# Patient Record
Sex: Female | Born: 1970 | Race: White | Hispanic: No | Marital: Single | Smoking: Current every day smoker
Health system: Southern US, Community
[De-identification: ages and names within clinical notes are randomized; demographics above are authoritative.]

## PROBLEM LIST (undated history)

## (undated) ENCOUNTER — Emergency Department (HOSPITAL_COMMUNITY): Admission: EM | Payer: Medicaid Other | Source: Home / Self Care

## (undated) ENCOUNTER — Emergency Department (HOSPITAL_COMMUNITY): Payer: Medicaid Other

## (undated) DIAGNOSIS — G629 Polyneuropathy, unspecified: Secondary | ICD-10-CM

## (undated) DIAGNOSIS — R519 Headache, unspecified: Secondary | ICD-10-CM

## (undated) DIAGNOSIS — M5430 Sciatica, unspecified side: Secondary | ICD-10-CM

## (undated) DIAGNOSIS — F32A Depression, unspecified: Secondary | ICD-10-CM

## (undated) DIAGNOSIS — M1990 Unspecified osteoarthritis, unspecified site: Secondary | ICD-10-CM

## (undated) DIAGNOSIS — J189 Pneumonia, unspecified organism: Secondary | ICD-10-CM

## (undated) DIAGNOSIS — M961 Postlaminectomy syndrome, not elsewhere classified: Secondary | ICD-10-CM

## (undated) DIAGNOSIS — G5600 Carpal tunnel syndrome, unspecified upper limb: Secondary | ICD-10-CM

## (undated) DIAGNOSIS — Z973 Presence of spectacles and contact lenses: Secondary | ICD-10-CM

## (undated) DIAGNOSIS — K439 Ventral hernia without obstruction or gangrene: Secondary | ICD-10-CM

## (undated) DIAGNOSIS — D649 Anemia, unspecified: Secondary | ICD-10-CM

## (undated) HISTORY — PX: KNEE SURGERY: SHX244

## (undated) HISTORY — PX: TONSILLECTOMY: SUR1361

---

## 2018-11-04 HISTORY — PX: LAMINECTOMY: SHX219

## 2020-01-03 HISTORY — PX: LUMBAR FUSION: SHX111

## 2020-03-01 HISTORY — PX: LUMBAR FUSION: SHX111

## 2020-11-04 HISTORY — PX: BILATERAL CARPAL TUNNEL RELEASE: SHX6508

## 2020-12-26 ENCOUNTER — Ambulatory Visit (HOSPITAL_COMMUNITY): Payer: Medicaid Other | Attending: Pain Medicine | Admitting: Physical Therapy

## 2020-12-26 ENCOUNTER — Other Ambulatory Visit: Payer: Self-pay

## 2020-12-26 DIAGNOSIS — M5416 Radiculopathy, lumbar region: Secondary | ICD-10-CM | POA: Diagnosis present

## 2020-12-26 DIAGNOSIS — M545 Low back pain, unspecified: Secondary | ICD-10-CM | POA: Insufficient documentation

## 2020-12-26 NOTE — Addendum Note (Signed)
Addended by: Josue Hector A on: 12/26/2020 04:55 PM   Modules accepted: Orders

## 2020-12-26 NOTE — Patient Instructions (Signed)
Access Code: [email or web address] URL: [email or web address] Date: 12/26/2020 Prepared by: Josue Hector  Exercises Lying Prone with 1 Pillow - 2-3 x daily - 7 x weekly - 1 sets - 1 reps - 3-5 minutes hold Lying Prone - 2-3 x daily - 7 x weekly - 1 sets - 1 reps - 3-5 minutes hold Supine Transversus Abdominis Bracing - Hands on Stomach - 2-3 x daily - 7 x weekly - 2 sets - 10 reps - 5 seconds hold

## 2020-12-26 NOTE — Therapy (Signed)
[place], [place], [address] Phone: [phone]   Fax:  [phone]  Physical Therapy Evaluation  Patient Details  Name: Wendy Krause MRN: [id number] Date of Birth: 06-19-1971 No data recorded  Encounter Date: 12/26/2020   PT End of Session - 12/26/20 1613    Visit Number 1    Number of Visits 8    Date for PT Re-Evaluation 01/23/21    Authorization Type Medicaid healthy blue (check auth)    PT Start Time 1520    PT Stop Time 1615    PT Time Calculation (min) 55 min    Activity Tolerance Patient tolerated treatment well    Behavior During Therapy [facility] for tasks assessed/performed           No past medical history on file.  The histories are not reviewed yet. Please review them in the "History" navigator section and refresh this [place].  There were no vitals filed for this visit.    Subjective Assessment - 12/26/20 1536    Subjective Patient presents to physical therapy with complaint of chronic low back pain. She had injury to L5-S1 2019. She had lumbar fusion in 2021, but it failed. She had a second surgery later that year. 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 She wears slippers because she can't wear shoes. She also reports bilateral radiculopathies in UEs. She has had testing for this and was diagnosed with carpal tunnel. She recently had lumbar injections which she says has helped some. Patient currently managing pain with prescribed medication.    Pertinent History Lumbar fusion x 2    Limitations Lifting;Standing;Walking;House hold activities    How long can you stand comfortably? 5 minutes    How long can you walk comfortably? pain with walking    Patient Stated Goals Pain relief, get back to function    Currently in Pain? Yes    Pain Score 7     Pain Location Back    Pain Orientation Posterior;Lower;Right;Left    Pain Descriptors / Indicators Sharp;Nagging;Pressure;Numbness;Tingling     Pain Type Chronic pain    Pain Radiating Towards Bilateral feet    Pain Onset More than a month ago    Pain Frequency Constant    Aggravating Factors  standing, walking, bending    Pain Relieving Factors Meds, heat    Effect of Pain on Daily Activities Limits              [facility] PT Assessment - 12/26/20 0001      Assessment   Medical Diagnosis LBP (P)     Referring Provider (PT) Sherlyn Lees MD (P)     Onset Date/Surgical Date -- (P)    chronic >several years   Prior Therapy No (P)       Precautions   Precautions None (P)       Restrictions   Weight Bearing Restrictions No (P)       Balance Screen   Has the patient fallen in the past 6 months Yes (P)     How many times? 3 (P)     Has the patient had a decrease in activity level because of a fear of falling?  Yes (P)     Is the patient reluctant to leave their home because of a fear of falling?  Yes (P)       Home Environment   Living Environment Private residence (P)     Living Arrangements Spouse/significant  other;Children (P)       Prior Function   Level of Independence Independent with basic ADLs (P)       Cognition   Overall Cognitive Status Within Functional Limits for tasks assessed (P)       ROM / Strength   AROM / PROM / Strength AROM;Strength (P)       AROM   AROM Assessment Site Lumbar (P)     Lumbar Flexion 50% limited (P)     Lumbar Extension 100% limited (P)     Lumbar - Right Side Bend 75% limited (P)     Lumbar - Left Side Bend 75% limited (P)       Transfers   Transfers Sit to Stand    Sit to Stand 5: Supervision    Comments slow labored movement wiht pain, decreased weight shift to LT      Ambulation/Gait   Ambulation/Gait Yes    Ambulation/Gait Assistance 5: Supervision    Ambulation Distance (Feet) 250 Feet    Assistive device None    Gait Pattern Decreased arm swing - left;Decreased stride length;Decreased stance time - left;Decreased step length - right;Antalgic;Trunk flexed     Ambulation Surface Level;Indoor    Gait Comments 2MWT                      Objective measurements completed on examination: See above findings.       [place] Adult PT Treatment/Exercise - 12/26/20 0001      Exercises   Exercises Lumbar      Lumbar Exercises: Supine   Ab Set 10 reps;5 seconds      Lumbar Exercises: Prone   Other Prone Lumbar Exercises prone lying with pillow under hips 3 min                  PT Education - 12/26/20 1539    Education Details on evaluation findings, POC and HEP    Person(s) Educated Patient    Methods Explanation;Handout    Comprehension Verbalized understanding            PT Short Term Goals - 12/26/20 1646      PT SHORT TERM GOAL #1   Title Patient will be independent with initial HEP and self-management strategies to improve functional outcomes    Time 2    Period Weeks    Status New    Target Date 01/09/21             PT Long Term Goals - 12/26/20 1646      PT LONG TERM GOAL #1   Title Patient will report at least 60% overall improvement in subjective complaint to indicate improvement in ability to perform ADLs.    Time 4    Period Weeks    Status New    Target Date 01/23/21      PT LONG TERM GOAL #2   Title Patient will be able to ambulate at least 325 feet during 2MWT with LRAD to demonstrate improved ability to perform functional mobility and associated tasks.    Time 4    Period Weeks    Status New    Target Date 01/23/21      PT LONG TERM GOAL #3   Title Patient will be able to stand > 15 minutes with pain not to exceed 3/10 in lumbar to improve ability to perform cooking and grooming ADLs.    Time 4    Period Weeks  Status New    Target Date 01/23/21      PT LONG TERM GOAL #4   Title Patient will improve lumbar AROM by 25% in all restricted planes for improved ability to perform functional mobility tasks and ADLs.    Time 4    Period Weeks    Status New    Target Date 01/23/21                   Plan - 12/26/20 1642    Clinical Impression Statement Patient is a 50 y.o. female who presents to physical therapy with complaint of low back pain with bilateral radiculopathy. Patient demonstrates decreased strength, ROM restriction, balance deficits and gait abnormalities which are likely contributing to symptoms of pain and are negatively impacting patient ability to perform ADLs and functional mobility tasks. Patient will benefit from skilled physical therapy services to address these deficits to reduce pain, improve level of function with ADLs, functional mobility tasks, and reduce risk for falls.    Examination-Activity Limitations Locomotion Level;Bed Mobility;Lift;Stand;Sleep;Stairs;Transfers;Bend    Examination-Participation Restrictions Laundry;Community Activity;Cleaning;Yard Work    Merchant [employer] officer Stable/Uncomplicated    Designer, jewellery Low    Rehab Potential Fair    PT Frequency 2x / week    PT Duration 4 weeks    PT Treatment/Interventions ADLs/Self Care Home Management;Aquatic Therapy;Biofeedback;Canalith Repostioning;Ultrasound;Traction;Functional mobility training;Stair training;DME Instruction;Gait training;Iontophoresis 4mg /ml Dexamethasone;Moist Heat;Electrical Stimulation;Contrast Bath;Therapeutic exercise;Therapeutic activities;Parrafin;Fluidtherapy;Cryotherapy;Scar mobilization;Balance training;Vestibular;Visual/perceptual remediation/compensation;Neuromuscular re-education;Passive range of motion;Dry needling;Manual techniques;Patient/family education;Manual lymph drainage;Energy conservation;Spinal Manipulations;Splinting;Joint Manipulations;Compression bandaging;Orthotic Fit/Training;Taping;Vasopneumatic Device    PT Next Visit Plan Review goals and HEP. Assess response to extension based HEP and progress to prone on elbows if tolerated well. Add glute set/ bridge, bent knee march. Manual for pain and restriciton as needed.     PT Home Exercise Plan Eval: ab set, prone lying with pillow> prone lying flat    Consulted and Agree with Plan of Care Patient           Patient will benefit from skilled therapeutic intervention in order to improve the following deficits and impairments:  Pain,Improper body mechanics,Impaired sensation,Increased fascial restricitons,Abnormal gait,Decreased balance,Difficulty walking,Decreased activity tolerance,Decreased range of motion,Decreased strength,Impaired perceived functional ability  Visit Diagnosis: Low back pain, unspecified back pain laterality, unspecified chronicity, unspecified whether sciatica present  Radiculopathy, lumbar region     Problem List There are no problems to display for this patient.  4:51 PM, 12/26/20 Josue Hector PT DPT  Physical Therapist with [facility]  [phone]   [facility] [address] [place], [place], [address] Phone: [phone]   Fax:  [phone]  Name: Destin Kittler MRN: [id number] Date of Birth: 1971/09/09

## 2021-01-02 ENCOUNTER — Encounter (HOSPITAL_COMMUNITY): Payer: Self-pay | Admitting: Physical Therapy

## 2021-01-02 ENCOUNTER — Ambulatory Visit (HOSPITAL_COMMUNITY): Payer: Medicaid Other | Attending: Pain Medicine | Admitting: Physical Therapy

## 2021-01-02 ENCOUNTER — Other Ambulatory Visit: Payer: Self-pay

## 2021-01-02 DIAGNOSIS — M545 Low back pain, unspecified: Secondary | ICD-10-CM | POA: Insufficient documentation

## 2021-01-02 DIAGNOSIS — M5416 Radiculopathy, lumbar region: Secondary | ICD-10-CM | POA: Diagnosis present

## 2021-01-02 NOTE — Therapy (Signed)
[place], [place], [address] Phone: [phone]   Fax:  [phone]  Physical Therapy Treatment  Patient Details  Name: Wendy Krause MRN: [id number] Date of Birth: Sep 14, 1971 No data recorded  Encounter Date: 01/02/2021   PT End of Session - 01/02/21 1735    Visit Number 2    Number of Visits 8    Date for PT Re-Evaluation 01/23/21    Authorization Type Medicaid healthy blue (check auth/ still pending 3/1)    PT Start Time 1730    PT Stop Time 1808    PT Time Calculation (min) 38 min    Activity Tolerance Patient limited by pain    Behavior During Therapy [facility] for tasks assessed/performed           History reviewed. No pertinent past medical history.  History reviewed. No pertinent surgical history.  There were no vitals filed for this visit.   Subjective Assessment - 01/02/21 1732    Subjective Patient says she is doing alright. Has good days and bad days. Today is not so bad. She has been compliant with HEP. HEP may be helping reduce leg pain.    Pertinent History Lumbar fusion x 2    Limitations Lifting;Standing;Walking;House hold activities    How long can you stand comfortably? 5 minutes    How long can you walk comfortably? pain with walking    Patient Stated Goals Pain relief, get back to function    Currently in Pain? Yes    Pain Score 5     Pain Location Back    Pain Orientation Posterior;Lower    Pain Descriptors / Indicators Sharp;Tingling;Nagging;Numbness    Pain Type Chronic pain    Pain Onset More than a month ago                             [facility] Adult PT Treatment/Exercise - 01/02/21 0001      Lumbar Exercises: [employer]   Press Ups 3 reps    Press Ups Limitations attempted, unable to complete per wrist pain      Lumbar Exercises: Seated   Other Seated Lumbar Exercises postural correction sitting wiht towel roll 3 min      Lumbar Exercises: Supine   Ab Set 10  reps;5 seconds    Bent Knee Raise 10 reps    Bent Knee Raise Limitations increase back pain      Lumbar Exercises: Prone   Other Prone Lumbar Exercises prone lying, no pillow, 3 min > prone on elbows 3 min    Other Prone Lumbar Exercises prone glute squeeze 10 x 5"                    PT Short Term Goals - 12/26/20 1646      PT SHORT TERM GOAL #1   Title Patient will be independent with initial HEP and self-management strategies to improve functional outcomes    Time 2    Period Weeks    Status New    Target Date 01/09/21             PT Long Term Goals - 12/26/20 1646      PT LONG TERM GOAL #1   Title Patient will report at least 60% overall improvement in subjective complaint to indicate improvement in ability to perform ADLs.    Time 4    Period Weeks  Status New    Target Date 01/23/21      PT LONG TERM GOAL #2   Title Patient will be able to ambulate at least 325 feet during 2MWT with LRAD to demonstrate improved ability to perform functional mobility and associated tasks.    Time 4    Period Weeks    Status New    Target Date 01/23/21      PT LONG TERM GOAL #3   Title Patient will be able to stand > 15 minutes with pain not to exceed 3/10 in lumbar to improve ability to perform cooking and grooming ADLs.    Time 4    Period Weeks    Status New    Target Date 01/23/21      PT LONG TERM GOAL #4   Title Patient will improve lumbar AROM by 25% in all restricted planes for improved ability to perform functional mobility tasks and ADLs.    Time 4    Period Weeks    Status New    Target Date 01/23/21                 Plan - 01/02/21 1810    Clinical Impression Statement Patient having trouble finding comfortable position during session. Unable to tolerate supine lying today, noting increased burning in lumbar. Patient did well with prone lying and prone on elbows. Attempted to progress to prone press ups but patient unable per wrist issues.  Performed prone on elbows after supine activity which helped calm burning pain. Educated patient on log rolling to avoid excess lumbar strain when sitting up. Also discussed benefits of possibly adding aquatic therapy. Added prone glute squeeze for strength progressions. Patient had difficulty with bent knee raises. Patient will continue to benefit from core strengthening to reduce back pain and improve functional level.    Examination-Activity Limitations Locomotion Level;Bed Mobility;Lift;Stand;Sleep;Stairs;Transfers;Bend    Examination-Participation Restrictions Laundry;Community Activity;Cleaning;Yard Work    Stability/Clinical Decision Making Stable/Uncomplicated    Rehab Potential Fair    PT Frequency 2x / week    PT Duration 4 weeks    PT Treatment/Interventions ADLs/Self Care Home Management;Aquatic Therapy;Biofeedback;Canalith Repostioning;Ultrasound;Traction;Functional mobility training;Stair training;DME Instruction;Gait training;Iontophoresis 4mg /ml Dexamethasone;Moist Heat;Electrical Stimulation;Contrast Bath;Therapeutic exercise;Therapeutic activities;Parrafin;Fluidtherapy;Cryotherapy;Scar mobilization;Balance training;Vestibular;Visual/perceptual remediation/compensation;Neuromuscular re-education;Passive range of motion;Dry needling;Manual techniques;Patient/family education;Manual lymph drainage;Energy conservation;Spinal Manipulations;Splinting;Joint Manipulations;Compression bandaging;Orthotic Fit/Training;Taping;Vasopneumatic Device    PT Next Visit Plan Continue with extension based progression, try REIS. Core and glute progressions as tolerated. Add clamshells, hip adduction iso    PT Home Exercise Plan Eval: ab set, prone lying with pillow> prone lying flat 3/1 prone glute squeeze    Consulted and Agree with Plan of Care Patient           Patient will benefit from skilled therapeutic intervention in order to improve the following deficits and impairments:  Pain,Improper body  mechanics,Impaired sensation,Increased fascial restricitons,Abnormal gait,Decreased balance,Difficulty walking,Decreased activity tolerance,Decreased range of motion,Decreased strength,Impaired perceived functional ability  Visit Diagnosis: Low back pain, unspecified back pain laterality, unspecified chronicity, unspecified whether sciatica present  Radiculopathy, lumbar region     Problem List There are no problems to display for this patient.  6:18 PM, 01/02/21 Josue Hector PT DPT  Physical Therapist with [facility]  [phone] [address] [place], [place], [address] Phone: [phone]   Fax:  [phone]  Name: Wendy Krause MRN: [id number] Date of Birth: [birth year]

## 2021-01-03 ENCOUNTER — Encounter (HOSPITAL_COMMUNITY): Payer: Self-pay | Admitting: Physical Therapy

## 2021-01-03 ENCOUNTER — Ambulatory Visit (HOSPITAL_COMMUNITY): Payer: Medicaid Other | Admitting: Physical Therapy

## 2021-01-03 DIAGNOSIS — M545 Low back pain, unspecified: Secondary | ICD-10-CM

## 2021-01-03 DIAGNOSIS — M5416 Radiculopathy, lumbar region: Secondary | ICD-10-CM

## 2021-01-03 NOTE — Therapy (Signed)
[place], [place], [address] Phone: [phone]   Fax:  [phone]  Physical Therapy Treatment  Patient Details  Name: Wendy Krause MRN: [id number] Date of Birth: 11-11-1970 No data recorded  Encounter Date: 01/03/2021   PT End of Session - 01/03/21 1737    Visit Number 3    Number of Visits 8    Date for PT Re-Evaluation 01/23/21    Authorization Type Medicaid healthy blue (check auth/ still pending 3/2)    PT Start Time 1730    PT Stop Time 1815    PT Time Calculation (min) 45 min    Activity Tolerance Patient limited by pain    Behavior During Therapy [facility] for tasks assessed/performed           History reviewed. No pertinent past medical history.  History reviewed. No pertinent surgical history.  There were no vitals filed for this visit.   Subjective Assessment - 01/03/21 1734    Subjective "Feeling ok". Compliant with HEP. Feels that her back is getting a little looser, but still painful.    Pertinent History Lumbar fusion x 2    Limitations Lifting;Standing;Walking;House hold activities    How long can you stand comfortably? 5 minutes    How long can you walk comfortably? pain with walking    Patient Stated Goals Pain relief, get back to function    Currently in Pain? Yes    Pain Score 4     Pain Location Back    Pain Orientation Posterior;Lower    Pain Descriptors / Indicators Tingling;Sharp;Numbness;Nagging    Pain Type Chronic pain    Pain Onset More than a month ago    Pain Frequency Constant                             [facility] Adult PT Treatment/Exercise - 01/03/21 0001      Lumbar Exercises: Stretches   Lower Trunk Rotation 5 reps;10 seconds    Prone on Elbows Stretch 3 reps;60 seconds      Lumbar Exercises: Standing   Other Standing Lumbar Exercises REIS x 10   (no effect, maybe more "tension")   Other Standing Lumbar Exercises standing anterior pelvic tilts x10  (decreased leg pain)      Lumbar Exercises: Supine   Ab Set 10 reps;5 seconds    Bent Knee Raise 10 reps    Other Supine Lumbar Exercises seated hip abduction/ adduction isometric 10 x 5" each                    PT Short Term Goals - 12/26/20 1646      PT SHORT TERM GOAL #1   Title Patient will be independent with initial HEP and self-management strategies to improve functional outcomes    Time 2    Period Weeks    Status New    Target Date 01/09/21             PT Long Term Goals - 12/26/20 1646      PT LONG TERM GOAL #1   Title Patient will report at least 60% overall improvement in subjective complaint to indicate improvement in ability to perform ADLs.    Time 4    Period Weeks    Status New    Target Date 01/23/21      PT LONG TERM GOAL #2   Title Patient will be  able to ambulate at least 325 feet during 2MWT with LRAD to demonstrate improved ability to perform functional mobility and associated tasks.    Time 4    Period Weeks    Status New    Target Date 01/23/21      PT LONG TERM GOAL #3   Title Patient will be able to stand > 15 minutes with pain not to exceed 3/10 in lumbar to improve ability to perform cooking and grooming ADLs.    Time 4    Period Weeks    Status New    Target Date 01/23/21      PT LONG TERM GOAL #4   Title Patient will improve lumbar AROM by 25% in all restricted planes for improved ability to perform functional mobility tasks and ADLs.    Time 4    Period Weeks    Status New    Target Date 01/23/21                 Plan - 01/03/21 1828    Clinical Impression Statement Patient with improved tolerance with todays activity though still pain limited. Able to progress to seated hip adduction and abduction. Patient notes burning in thigh muscles. Attempted extension in standing due to patient unable to progress extension-based exercise in prone because she has wrist pain. Patient response somewhat unclear, noting  increased "tension" but unable to clearly describe this as increased pain or radicular symptoms. Added anterior pelvic tilts instead to increase lumbar extension in standing. Patient notes decreased leg pain. Added these to HEP. Patient will continue to benefit from graded exercise progressions to reduce back pain and improve functional level.    Examination-Activity Limitations Locomotion Level;Bed Mobility;Lift;Stand;Sleep;Stairs;Transfers;Bend    Examination-Participation Restrictions Laundry;Community Activity;Cleaning;Yard Work    Stability/Clinical Decision Making Stable/Uncomplicated    Rehab Potential Fair    PT Frequency 2x / week    PT Duration 4 weeks    PT Treatment/Interventions ADLs/Self Care Home Management;Aquatic Therapy;Biofeedback;Canalith Repostioning;Ultrasound;Traction;Functional mobility training;Stair training;DME Instruction;Gait training;Iontophoresis 4mg /ml Dexamethasone;Moist Heat;Electrical Stimulation;Contrast Bath;Therapeutic exercise;Therapeutic activities;Parrafin;Fluidtherapy;Cryotherapy;Scar mobilization;Balance training;Vestibular;Visual/perceptual remediation/compensation;Neuromuscular re-education;Passive range of motion;Dry needling;Manual techniques;Patient/family education;Manual lymph drainage;Energy conservation;Spinal Manipulations;Splinting;Joint Manipulations;Compression bandaging;Orthotic Fit/Training;Taping;Vasopneumatic Device    PT Next Visit Plan Continue with extension based progression. Add palloff press, heel raises    PT Home Exercise Plan Eval: ab set, prone lying with pillow> prone lying flat 3/1 prone glute squeeze 3/2 seated hip abduction/ adduction, anterior pelvic tilts    Consulted and Agree with Plan of Care Patient           Patient will benefit from skilled therapeutic intervention in order to improve the following deficits and impairments:  Pain,Improper body mechanics,Impaired sensation,Increased fascial restricitons,Abnormal  gait,Decreased balance,Difficulty walking,Decreased activity tolerance,Decreased range of motion,Decreased strength,Impaired perceived functional ability  Visit Diagnosis: Low back pain, unspecified back pain laterality, unspecified chronicity, unspecified whether sciatica present  Radiculopathy, lumbar region     Problem List There are no problems to display for this patient.  6:31 PM, 01/03/21 Josue Hector PT DPT  Physical Therapist with [facility]  [phone] [place] [address] [place], [place], [address] Phone: [phone]   Fax:  [phone]  Name: Clarabelle Oscarson MRN: [id number] Date of Birth: [birth year]

## 2021-01-03 NOTE — Patient Instructions (Signed)
Access Code: [email or web address] URL: [email or web address] Date: 01/03/2021 Prepared by: Josue Hector  Exercises Standing Anterior Pelvic Tilt - 2-3 x daily - 7 x weekly - 2 sets - 10 reps - 5 seconds hold Seated Hip Adduction Isometrics with Ball - 2-3 x daily - 7 x weekly - 2 sets - 10 reps - 5 seconds hold Seated Isometric Hip Abduction with Belt - 2-3 x daily - 7 x weekly - 2 sets - 10 reps - 5 seconds hold

## 2021-01-09 ENCOUNTER — Other Ambulatory Visit: Payer: Self-pay

## 2021-01-09 ENCOUNTER — Encounter (HOSPITAL_COMMUNITY): Payer: Self-pay | Admitting: Physical Therapy

## 2021-01-09 ENCOUNTER — Ambulatory Visit (HOSPITAL_COMMUNITY): Payer: Medicaid Other | Admitting: Physical Therapy

## 2021-01-09 DIAGNOSIS — M545 Low back pain, unspecified: Secondary | ICD-10-CM

## 2021-01-09 DIAGNOSIS — M5416 Radiculopathy, lumbar region: Secondary | ICD-10-CM

## 2021-01-09 NOTE — Therapy (Signed)
[place], [place], [address] Phone: [phone]   Fax:  [phone]  Physical Therapy Treatment  Patient Details  Name: Wendy Krause MRN: [id number] Date of Birth: 09/25/1971 No data recorded  Encounter Date: 01/09/2021   PT End of Session - 01/09/21 1738    Visit Number 4    Number of Visits 8    Date for PT Re-Evaluation 01/23/21    Authorization Type Medicaid healthy blue (check auth/ still pending 3/8)    PT Start Time 1730    PT Stop Time 1815    PT Time Calculation (min) 45 min    Activity Tolerance Patient limited by pain    Behavior During Therapy [facility] for tasks assessed/performed           History reviewed. No pertinent past medical history.  History reviewed. No pertinent surgical history.  There were no vitals filed for this visit.   Subjective Assessment - 01/09/21 1737    Subjective Patient says her back and legs are sore. Her arm went numb after last time she says    Pertinent History Lumbar fusion x 2    Limitations Lifting;Standing;Walking;House hold activities    How long can you stand comfortably? 5 minutes    How long can you walk comfortably? pain with walking    Patient Stated Goals Pain relief, get back to function    Currently in Pain? Yes    Pain Score 8     Pain Location Back    Pain Orientation Lower;Posterior    Pain Descriptors / Indicators Nagging;Numbness;Sharp;Tingling    Pain Type Chronic pain    Pain Onset More than a month ago    Pain Frequency Constant                             [facility] Adult PT Treatment/Exercise - 01/09/21 0001      Lumbar Exercises: Standing   Other Standing Lumbar Exercises REIS 2 x 10    Other Standing Lumbar Exercises standing anterior pelvic tilts x10      Lumbar Exercises: Supine   Clam 20 reps    Clam Limitations GTB    Bent Knee Raise 20 reps    Other Supine Lumbar Exercises iso hip adduction 10 x 5"      Lumbar  Exercises: Prone   Other Prone Lumbar Exercises prone on elbows 3 min      Manual Therapy   Manual Therapy Soft tissue mobilization    Manual therapy comments Performed separate from all other activity    Soft tissue mobilization IASTM to lumbar paraspinals wiht patient in prone                    PT Short Term Goals - 12/26/20 1646      PT SHORT TERM GOAL #1   Title Patient will be independent with initial HEP and self-management strategies to improve functional outcomes    Time 2    Period Weeks    Status New    Target Date 01/09/21             PT Long Term Goals - 12/26/20 1646      PT LONG TERM GOAL #1   Title Patient will report at least 60% overall improvement in subjective complaint to indicate improvement in ability to perform ADLs.    Time 4    Period Weeks  Status New    Target Date 01/23/21      PT LONG TERM GOAL #2   Title Patient will be able to ambulate at least 325 feet during 2MWT with LRAD to demonstrate improved ability to perform functional mobility and associated tasks.    Time 4    Period Weeks    Status New    Target Date 01/23/21      PT LONG TERM GOAL #3   Title Patient will be able to stand > 15 minutes with pain not to exceed 3/10 in lumbar to improve ability to perform cooking and grooming ADLs.    Time 4    Period Weeks    Status New    Target Date 01/23/21      PT LONG TERM GOAL #4   Title Patient will improve lumbar AROM by 25% in all restricted planes for improved ability to perform functional mobility tasks and ADLs.    Time 4    Period Weeks    Status New    Target Date 01/23/21                 Plan - 01/09/21 1827    Clinical Impression Statement Patient with elevated lumbar pain on arrival. Pain reduced with prone extension series. Patient notes no effect of extension in standing. Patient was able to progress LE strengthening with adding green band to supine clamshells. Patient also showing better tolerance  with bent knee raise and was able to perform more reps, but with slight increased lumbar pain. Added manual IASTM at end of session to address lumbar pain and restriction. Patient tolerated this very well and noted pain reduction in lumbar from 8/10 to 5/10. Patient will continue to benefit from skilled therapy interventions to reduce restrictions and improve functional level.    Examination-Activity Limitations Locomotion Level;Bed Mobility;Lift;Stand;Sleep;Stairs;Transfers;Bend    Examination-Participation Restrictions Laundry;Community Activity;Cleaning;Yard Work    Stability/Clinical Decision Making Stable/Uncomplicated    Rehab Potential Fair    PT Frequency 2x / week    PT Duration 4 weeks    PT Treatment/Interventions ADLs/Self Care Home Management;Aquatic Therapy;Biofeedback;Canalith Repostioning;Ultrasound;Traction;Functional mobility training;Stair training;DME Instruction;Gait training;Iontophoresis 4mg /ml Dexamethasone;Moist Heat;Electrical Stimulation;Contrast Bath;Therapeutic exercise;Therapeutic activities;Parrafin;Fluidtherapy;Cryotherapy;Scar mobilization;Balance training;Vestibular;Visual/perceptual remediation/compensation;Neuromuscular re-education;Passive range of motion;Dry needling;Manual techniques;Patient/family education;Manual lymph drainage;Energy conservation;Spinal Manipulations;Splinting;Joint Manipulations;Compression bandaging;Orthotic Fit/Training;Taping;Vasopneumatic Device    PT Next Visit Plan Assess response to manual IASTM. Continue with extension based progression. Add palloff press, heel raises    PT Home Exercise Plan Eval: ab set, prone lying with pillow> prone lying flat 3/1 prone glute squeeze 3/2 seated hip abduction/ adduction, anterior pelvic tilts    Consulted and Agree with Plan of Care Patient           Patient will benefit from skilled therapeutic intervention in order to improve the following deficits and impairments:  Pain,Improper body  mechanics,Impaired sensation,Increased fascial restricitons,Abnormal gait,Decreased balance,Difficulty walking,Decreased activity tolerance,Decreased range of motion,Decreased strength,Impaired perceived functional ability  Visit Diagnosis: Low back pain, unspecified back pain laterality, unspecified chronicity, unspecified whether sciatica present  Radiculopathy, lumbar region     Problem List There are no problems to display for this patient.  6:28 PM, 01/09/21 Josue Hector PT DPT  Physical Therapist with [facility]  [phone] [place] [address] [place], [place], [address] Phone: [phone]   Fax:  [phone]  Name: Wendy Krause MRN: [id number] Date of Birth: 01/26/1971

## 2021-01-11 ENCOUNTER — Ambulatory Visit (HOSPITAL_COMMUNITY): Payer: Medicaid Other | Admitting: Physical Therapy

## 2021-01-11 ENCOUNTER — Other Ambulatory Visit: Payer: Self-pay

## 2021-01-11 ENCOUNTER — Encounter (HOSPITAL_COMMUNITY): Payer: Self-pay | Admitting: Physical Therapy

## 2021-01-11 DIAGNOSIS — M5416 Radiculopathy, lumbar region: Secondary | ICD-10-CM

## 2021-01-11 DIAGNOSIS — M545 Low back pain, unspecified: Secondary | ICD-10-CM | POA: Diagnosis not present

## 2021-01-11 NOTE — Therapy (Signed)
[place], [place], [address] Phone: [phone]   Fax:  [phone]  Physical Therapy Treatment  Patient Details  Name: Wendy Krause MRN: [id number] Date of Birth: 1971-03-10 No data recorded  Encounter Date: 01/11/2021   PT End of Session - 01/11/21 1732    Visit Number 5    Number of Visits 8    Date for PT Re-Evaluation 01/23/21    Authorization Type Medicaid healthy blue (check auth/ still pending 3/10)    PT Start Time 8144    PT Stop Time 1810    PT Time Calculation (min) 39 min    Activity Tolerance Patient limited by pain;Patient tolerated treatment well    Behavior During Therapy [facility] for tasks assessed/performed           History reviewed. No pertinent past medical history.  History reviewed. No pertinent surgical history.  There were no vitals filed for this visit.   Subjective Assessment - 01/11/21 1742    Subjective Patient says she is feeling a little better today. She had f/u with MD about injections yesterday. She says they found a bunch of knots in her back. They recommend she try injections or acupuncture.    Pertinent History Lumbar fusion x 2    Limitations Lifting;Standing;Walking;House hold activities    How long can you stand comfortably? 5 minutes    How long can you walk comfortably? pain with walking    Patient Stated Goals Pain relief, get back to function    Currently in Pain? Yes    Pain Score 7     Pain Location Back    Pain Orientation Lower;Posterior    Pain Descriptors / Indicators Nagging;Sharp;Tingling    Pain Type Chronic pain    Pain Onset More than a month ago    Pain Frequency Constant                             [facility] Adult PT Treatment/Exercise - 01/11/21 0001      Lumbar Exercises: Standing   Heel Raises 20 reps    Other Standing Lumbar Exercises palloff press GTB x15 each      Lumbar Exercises: Supine   Ab Set 10 reps;5 seconds     Clam 20 reps    Clam Limitations GTB    Bent Knee Raise 20 reps    Dead Bug 5 reps   pain   Bridge 10 reps      Manual Therapy   Manual Therapy Soft tissue mobilization    Manual therapy comments Performed separate from all other activity    Soft tissue mobilization IASTM to lumbar paraspinals wiht patient in prone                    PT Short Term Goals - 12/26/20 1646      PT SHORT TERM GOAL #1   Title Patient will be independent with initial HEP and self-management strategies to improve functional outcomes    Time 2    Period Weeks    Status New    Target Date 01/09/21             PT Long Term Goals - 12/26/20 1646      PT LONG TERM GOAL #1   Title Patient will report at least 60% overall improvement in subjective complaint to indicate improvement in ability to perform ADLs.  Time 4    Period Weeks    Status New    Target Date 01/23/21      PT LONG TERM GOAL #2   Title Patient will be able to ambulate at least 325 feet during 2MWT with LRAD to demonstrate improved ability to perform functional mobility and associated tasks.    Time 4    Period Weeks    Status New    Target Date 01/23/21      PT LONG TERM GOAL #3   Title Patient will be able to stand > 15 minutes with pain not to exceed 3/10 in lumbar to improve ability to perform cooking and grooming ADLs.    Time 4    Period Weeks    Status New    Target Date 01/23/21      PT LONG TERM GOAL #4   Title Patient will improve lumbar AROM by 25% in all restricted planes for improved ability to perform functional mobility tasks and ADLs.    Time 4    Period Weeks    Status New    Target Date 01/23/21                 Plan - 01/11/21 1818    Clinical Impression Statement Patient shows somewhat improved tolerance with prior ther ex. Some pain noted with progressions. Did ok with bridging. Notes increased burning with dead bugs, unable to complete set. Added standing ther ex as patient notes  supine lying bothers her back due to scar tissue. Educated on proper form and function of added palloff press for core strength progression. Patient able to do this with no increased pain. Notes pain reduction following manual intervention. Patient will continue to benefit for therapy to reduce restrictions and improve functional level.    Examination-Activity Limitations Locomotion Level;Bed Mobility;Lift;Stand;Sleep;Stairs;Transfers;Bend    Examination-Participation Restrictions Laundry;Community Activity;Cleaning;Yard Work    Stability/Clinical Decision Making Stable/Uncomplicated    Rehab Potential Fair    PT Frequency 2x / week    PT Duration 4 weeks    PT Treatment/Interventions ADLs/Self Care Home Management;Aquatic Therapy;Biofeedback;Canalith Repostioning;Ultrasound;Traction;Functional mobility training;Stair training;DME Instruction;Gait training;Iontophoresis 4mg /ml Dexamethasone;Moist Heat;Electrical Stimulation;Contrast Bath;Therapeutic exercise;Therapeutic activities;Parrafin;Fluidtherapy;Cryotherapy;Scar mobilization;Balance training;Vestibular;Visual/perceptual remediation/compensation;Neuromuscular re-education;Passive range of motion;Dry needling;Manual techniques;Patient/family education;Manual lymph drainage;Energy conservation;Spinal Manipulations;Splinting;Joint Manipulations;Compression bandaging;Orthotic Fit/Training;Taping;Vasopneumatic Device    PT Next Visit Plan Assess response to manual IASTM. Continue with extension based progression. Add hip abduction, step ups    PT Home Exercise Plan Eval: ab set, prone lying with pillow> prone lying flat 3/1 prone glute squeeze 3/2 seated hip abduction/ adduction, anterior pelvic tilts    Consulted and Agree with Plan of Care Patient           Patient will benefit from skilled therapeutic intervention in order to improve the following deficits and impairments:  Pain,Improper body mechanics,Impaired sensation,Increased fascial  restricitons,Abnormal gait,Decreased balance,Difficulty walking,Decreased activity tolerance,Decreased range of motion,Decreased strength,Impaired perceived functional ability  Visit Diagnosis: Low back pain, unspecified back pain laterality, unspecified chronicity, unspecified whether sciatica present  Radiculopathy, lumbar region     Problem List There are no problems to display for this patient.  6:23 PM, 01/11/21 Josue Hector PT DPT  Physical Therapist with [facility]  [phone] [place] [address] [place], [place], [address] Phone: [phone]   Fax:  [phone]  Name: Wendy Krause MRN: [id number] Date of Birth: 23-May-1971

## 2021-01-16 ENCOUNTER — Other Ambulatory Visit: Payer: Self-pay

## 2021-01-16 ENCOUNTER — Encounter (HOSPITAL_COMMUNITY): Payer: Self-pay | Admitting: Physical Therapy

## 2021-01-16 ENCOUNTER — Ambulatory Visit (HOSPITAL_COMMUNITY): Payer: Medicaid Other | Admitting: Physical Therapy

## 2021-01-16 DIAGNOSIS — M5416 Radiculopathy, lumbar region: Secondary | ICD-10-CM

## 2021-01-16 DIAGNOSIS — M545 Low back pain, unspecified: Secondary | ICD-10-CM

## 2021-01-17 NOTE — Progress Notes (Signed)
   01/16/21 0001  Exercises  Exercises Knee/Hip  Lumbar Exercises: Stretches  Piriformis Stretch Right;3 reps;30 seconds  Piriformis Stretch Limitations seated  Lumbar Exercises: Standing  Other Standing Lumbar Exercises palloff press GTB 2 x10 each, palloff walkout GTB x 10 each  Knee/Hip Exercises: Standing  Heel Raises Both;20 reps  Hip Abduction Both;2 sets;10 reps  Hip Extension Both;2 sets;10 reps  Forward Step Up Both;2 sets;10 reps;Hand Hold: 2;Step Height: 6"  Functional Squat 1 set;10 reps (mini squats)  Manual Therapy  Manual Therapy Soft tissue mobilization  Manual therapy comments Performed separate from all other activity  Soft tissue mobilization IASTM to lumbar paraspinals wiht patient in prone

## 2021-01-17 NOTE — Therapy (Signed)
[place], [place], [address] Phone: [phone]   Fax:  [phone]  Physical Therapy Treatment  Patient Details  Name: Wendy Krause MRN: [id number] Date of Birth: 11-Jul-1971 No data recorded  Encounter Date: 01/16/2021   PT End of Session - 01/16/21 1742    Visit Number 6    Number of Visits 8    Date for PT Re-Evaluation 01/23/21    Authorization Type Medicaid healthy blue    Authorization Time Period 2/23-3/23/22    Authorization - Visit Number 6    Authorization - Number of Visits 8    PT Start Time 2800    PT Stop Time 3491    PT Time Calculation (min) 45 min    Activity Tolerance Patient limited by pain;Patient tolerated treatment well    Behavior During Therapy [facility] for tasks assessed/performed           History reviewed. No pertinent past medical history.  History reviewed. No pertinent surgical history.  There were no vitals filed for this visit.   Subjective Assessment - 01/16/21 1740    Subjective Patient reports she is not bad today, a little sore. Back is about a 7.    Pertinent History Lumbar fusion x 2    Limitations Lifting;Standing;Walking;House hold activities    How long can you stand comfortably? 5 minutes    How long can you walk comfortably? pain with walking    Patient Stated Goals Pain relief, get back to function    Currently in Pain? Yes    Pain Score 7     Pain Location Back    Pain Orientation Posterior;Lower    Pain Descriptors / Indicators Aching;Nagging    Pain Type Chronic pain    Pain Onset More than a month ago    Pain Frequency Constant             01/16/21 0001  Exercises  Exercises Knee/Hip  Lumbar Exercises: Stretches  Piriformis Stretch Right;3 reps;30 seconds  Piriformis Stretch Limitations seated  Lumbar Exercises: Standing  Other Standing Lumbar Exercises palloff press GTB 2 x10 each, palloff walkout GTB x 10 each  Knee/Hip Exercises: Standing   Heel Raises Both;20 reps  Hip Abduction Both;2 sets;10 reps  Hip Extension Both;2 sets;10 reps  Forward Step Up Both;2 sets;10 reps;Hand Hold: 2;Step Height: 6"  Functional Squat 1 set;10 reps (mini squats)  Manual Therapy  Manual Therapy Soft tissue mobilization  Manual therapy comments Performed separate from all other activity  Soft tissue mobilization IASTM to lumbar paraspinals wiht patient in prone      PT Short Term Goals - 12/26/20 1646      PT SHORT TERM GOAL #1   Title Patient will be independent with initial HEP and self-management strategies to improve functional outcomes    Time 2    Period Weeks    Status New    Target Date 01/09/21             PT Long Term Goals - 12/26/20 1646      PT LONG TERM GOAL #1   Title Patient will report at least 60% overall improvement in subjective complaint to indicate improvement in ability to perform ADLs.    Time 4    Period Weeks    Status New    Target Date 01/23/21      PT LONG TERM GOAL #2   Title Patient will be able to ambulate at least 325  feet during 2MWT with LRAD to demonstrate improved ability to perform functional mobility and associated tasks.    Time 4    Period Weeks    Status New    Target Date 01/23/21      PT LONG TERM GOAL #3   Title Patient will be able to stand > 15 minutes with pain not to exceed 3/10 in lumbar to improve ability to perform cooking and grooming ADLs.    Time 4    Period Weeks    Status New    Target Date 01/23/21      PT LONG TERM GOAL #4   Title Patient will improve lumbar AROM by 25% in all restricted planes for improved ability to perform functional mobility tasks and ADLs.    Time 4    Period Weeks    Status New    Target Date 01/23/21                 Plan - 01/17/21 1457    Clinical Impression Statement Progressed standing core and hip strengthening. Patient tolerated this well. Patient noting increased muscle fatigue with added standing hip abduction and  extension. Patient cued on proper form and to avoid excessive trunk lean. Also added palloff press walkout. Patient educated on form and function. Patient able to complete with no increased report of pain. Patient notes pain reduction following session. Patient will continue to benefit from skilled therapy services to progress hip and core strength to reduce back pain and improve functional ability.    Examination-Activity Limitations Locomotion Level;Bed Mobility;Lift;Stand;Sleep;Stairs;Transfers;Bend    Examination-Participation Restrictions Laundry;Community Activity;Cleaning;Yard Work    Stability/Clinical Decision Making Stable/Uncomplicated    Rehab Potential Fair    PT Frequency 2x / week    PT Duration 4 weeks    PT Treatment/Interventions ADLs/Self Care Home Management;Aquatic Therapy;Biofeedback;Canalith Repostioning;Ultrasound;Traction;Functional mobility training;Stair training;DME Instruction;Gait training;Iontophoresis 4mg /ml Dexamethasone;Moist Heat;Electrical Stimulation;Contrast Bath;Therapeutic exercise;Therapeutic activities;Parrafin;Fluidtherapy;Cryotherapy;Scar mobilization;Balance training;Vestibular;Visual/perceptual remediation/compensation;Neuromuscular re-education;Passive range of motion;Dry needling;Manual techniques;Patient/family education;Manual lymph drainage;Energy conservation;Spinal Manipulations;Splinting;Joint Manipulations;Compression bandaging;Orthotic Fit/Training;Taping;Vasopneumatic Device    PT Next Visit Plan Assess response to manual IASTM. Continue with extension based progression. Add palloff twist, lift/chops, birddogs.    PT Home Exercise Plan Eval: ab set, prone lying with pillow> prone lying flat 3/1 prone glute squeeze 3/2 seated hip abduction/ adduction, anterior pelvic tilts    Consulted and Agree with Plan of Care Patient           Patient will benefit from skilled therapeutic intervention in order to improve the following deficits and  impairments:  Pain,Improper body mechanics,Impaired sensation,Increased fascial restricitons,Abnormal gait,Decreased balance,Difficulty walking,Decreased activity tolerance,Decreased range of motion,Decreased strength,Impaired perceived functional ability  Visit Diagnosis: Low back pain, unspecified back pain laterality, unspecified chronicity, unspecified whether sciatica present  Radiculopathy, lumbar region     Problem List There are no problems to display for this patient.  3:03 PM, 01/17/21 Josue Hector PT DPT  Physical Therapist with [facility]  [phone] [place] [address] [place], [place], [address] Phone: [phone]   Fax:  [phone]  Name: Wendy Krause MRN: [id number] Date of Birth: [birth year]

## 2021-01-18 ENCOUNTER — Telehealth (HOSPITAL_COMMUNITY): Payer: Self-pay | Admitting: Physical Therapy

## 2021-01-18 ENCOUNTER — Ambulatory Visit (HOSPITAL_COMMUNITY): Payer: Medicaid Other | Admitting: Physical Therapy

## 2021-01-18 NOTE — Telephone Encounter (Signed)
pt cancelled appt for today because she does not have a way to get here

## 2021-01-23 ENCOUNTER — Encounter (HOSPITAL_COMMUNITY): Payer: Self-pay | Admitting: Physical Therapy

## 2021-01-23 ENCOUNTER — Ambulatory Visit (HOSPITAL_COMMUNITY): Payer: Medicaid Other | Admitting: Physical Therapy

## 2021-01-23 ENCOUNTER — Other Ambulatory Visit: Payer: Self-pay

## 2021-01-23 DIAGNOSIS — M545 Low back pain, unspecified: Secondary | ICD-10-CM

## 2021-01-23 DIAGNOSIS — M5416 Radiculopathy, lumbar region: Secondary | ICD-10-CM

## 2021-01-23 NOTE — Therapy (Signed)
[facility] [address] [place], [place], [address] Phone: [phone]   Fax:  [phone]  Physical Therapy Treatment  Patient Details  Name: Wendy Krause MRN: [id number] Date of Birth: 10/08/1971 Referring Provider (PT): Sherlyn Lees MD  Progress Note Reporting Period 12/27/20 to 01/23/21  See note below for Objective Data and Assessment of Progress/Goals.      Encounter Date: 01/23/2021   PT End of Session - 01/23/21 1742    Visit Number 7    Number of Visits 15    Date for PT Re-Evaluation 02/20/21    Authorization Type Medicaid healthy blue    Authorization Time Period 2/23-3/23/22 (submitted 8 more, check auth)    Authorization - Visit Number 7    Authorization - Number of Visits 8    PT Start Time 6314    PT Stop Time 1810    PT Time Calculation (min) 38 min    Activity Tolerance Patient tolerated treatment well    Behavior During Therapy WFL for tasks assessed/performed           History reviewed. No pertinent past medical history.  History reviewed. No pertinent surgical history.  There were no vitals filed for this visit.   Subjective Assessment - 01/23/21 1737    Subjective Patient says she feels less pain in her legs and they "don't go as crazy". She says she feels it more in her backside. She feels things are improving overall. She reports about 30% improvement since starting therapy.    Pertinent History Lumbar fusion x 2    Limitations Lifting;Standing;Walking;House hold activities    How long can you stand comfortably? 2 hours    How long can you walk comfortably? 1 hour    Patient Stated Goals Pain relief, get back to function    Currently in Pain? Yes    Pain Score 4     Pain Location Back    Pain Orientation Posterior;Lower    Pain Descriptors / Indicators Aching;Sharp;Numbness    Pain Type Chronic pain    Pain Radiating Towards Lt foot, RT thigh (this is much improved per patient report)    Pain  Onset More than a month ago    Pain Frequency Constant    Aggravating Factors  standing, walking, bending    Pain Relieving Factors Meds, exercises    Effect of Pain on Daily Activities Limits              [facility] PT Assessment - 01/23/21 0001      Assessment   Medical Diagnosis LBP    Referring Provider (PT) Sherlyn Lees MD      Precautions   Precautions None      Restrictions   Weight Bearing Restrictions No      [place] residence    Living Arrangements Spouse/significant other;Children      Prior Function   Level of Independence Independent with basic ADLs      Cognition   Overall Cognitive Status Within Functional Limits for tasks assessed      AROM   Lumbar Flexion 40% limited    Lumbar Extension 75% limited    Lumbar - Right Side Bend Good motion but pain at end range   was 75% limited   Lumbar - Left Side Bend 25% limited   75% limited     Palpation   Palpation comment Min/mod TTP about bilateral lumbar paraspinals  Ambulation/Gait   Ambulation/Gait Yes    Ambulation/Gait Assistance 6: Modified independent (Device/Increase time)    Ambulation Distance (Feet) 340 Feet    Assistive device None    Gait Pattern Decreased arm swing - left;Decreased stride length;Decreased stance time - left;Decreased step length - right;Antalgic;Decreased hip/knee flexion - left    Ambulation Surface Level;Indoor    Gait Comments 2MWT                         [facility] Adult PT Treatment/Exercise - 01/23/21 0001      Manual Therapy   Manual Therapy Soft tissue mobilization    Manual therapy comments Performed separate from all other activity    Soft tissue mobilization IASTM to lumbar paraspinals wiht patient in prone                    PT Short Term Goals - 01/23/21 1742      PT SHORT TERM GOAL #1   Title Patient will be independent with initial HEP and self-management strategies to improve functional outcomes     Baseline Reports compliance, demos good return    Time 2    Period Weeks    Status Achieved    Target Date 01/09/21             PT Long Term Goals - 01/23/21 1743      PT LONG TERM GOAL #1   Title Patient will report at least 60% overall improvement in subjective complaint to indicate improvement in ability to perform ADLs.    Baseline Reports 30% improvement    Time 4    Period Weeks    Status On-going      PT LONG TERM GOAL #2   Title Patient will be able to ambulate at least 325 feet during 2MWT with LRAD to demonstrate improved ability to perform functional mobility and associated tasks.    Baseline Current 340    Time 4    Period Weeks    Status Achieved      PT LONG TERM GOAL #3   Title Patient will be able to stand > 15 minutes with pain not to exceed 3/10 in lumbar to improve ability to perform cooking and grooming ADLs.    Baseline Can stand >15 minutes but current average pain 4-5    Time 4    Period Weeks    Status Partially Met      PT LONG TERM GOAL #4   Title Patient will improve lumbar AROM by 25% in all restricted planes for improved ability to perform functional mobility tasks and ADLs.    Baseline See AROM    Time 4    Period Weeks    Status Partially Met                 Plan - 01/23/21 1815    Clinical Impression Statement Patient is progressing well toward therapy goals. Patient shows overall improved activity tolerance, ROM and decreased pain levels. Patient describes centralizing of radicular pain, and that leg pain has decreased, making it easier to stand and walk. Patient still limited by ROM and fascial restrictions, and increased pain with certain activity which continue to negatively impact functional ability. Patient will continue to benefit from skilled therapy services to progress lumbar mobility and core strength to further reduce pain and improve LOF with ADLs.    Examination-Activity Limitations Locomotion Level;Bed  Mobility;Lift;Stand;Sleep;Stairs;Transfers;Bend    Examination-Participation Restrictions Laundry;Community  Activity;Cleaning;Yard Work    Stability/Clinical Decision Making Stable/Uncomplicated    Rehab Potential Fair    PT Frequency 2x / week    PT Duration 4 weeks    PT Treatment/Interventions ADLs/Self Care Home Management;Aquatic Therapy;Biofeedback;Canalith Repostioning;Ultrasound;Traction;Functional mobility training;Stair training;DME Instruction;Gait training;Iontophoresis 46m/ml Dexamethasone;Moist Heat;Electrical Stimulation;Contrast Bath;Therapeutic exercise;Therapeutic activities;Parrafin;Fluidtherapy;Cryotherapy;Scar mobilization;Balance training;Vestibular;Visual/perceptual remediation/compensation;Neuromuscular re-education;Passive range of motion;Dry needling;Manual techniques;Patient/family education;Manual lymph drainage;Energy conservation;Spinal Manipulations;Splinting;Joint Manipulations;Compression bandaging;Orthotic Fit/Training;Taping;Vasopneumatic Device    PT Next Visit Plan Continue with extension based progression. Add palloff twist, lift/chops, birddogs.    PT Home Exercise Plan Eval: ab set, prone lying with pillow> prone lying flat 3/1 prone glute squeeze 3/2 seated hip abduction/ adduction, anterior pelvic tilts    Consulted and Agree with Plan of Care Patient           Patient will benefit from skilled therapeutic intervention in order to improve the following deficits and impairments:  Pain,Improper body mechanics,Impaired sensation,Increased fascial restricitons,Abnormal gait,Decreased balance,Difficulty walking,Decreased activity tolerance,Decreased range of motion,Decreased strength,Impaired perceived functional ability  Visit Diagnosis: Low back pain, unspecified back pain laterality, unspecified chronicity, unspecified whether sciatica present  Radiculopathy, lumbar region     Problem List There are no problems to display for this patient.   6:23  PM, 01/23/21 CJosue HectorPT DPT  [facility] [phone] [address]Phone: [phone]  Fax:  [phone] Name: DLillyn WieczorekMRN: [id number] of Birth: 908/06/1971

## 2021-01-25 ENCOUNTER — Ambulatory Visit (HOSPITAL_COMMUNITY): Payer: Medicaid Other | Admitting: Physical Therapy

## 2021-01-25 ENCOUNTER — Encounter (HOSPITAL_COMMUNITY): Payer: Self-pay | Admitting: Physical Therapy

## 2021-01-25 ENCOUNTER — Other Ambulatory Visit: Payer: Self-pay

## 2021-01-25 DIAGNOSIS — M5416 Radiculopathy, lumbar region: Secondary | ICD-10-CM

## 2021-01-25 DIAGNOSIS — M545 Low back pain, unspecified: Secondary | ICD-10-CM

## 2021-01-25 NOTE — Therapy (Signed)
[facility] [place], [place], [address] Phone: [phone]   Fax:  [phone]  Physical Therapy Treatment  Patient Details  Name: Wendy Krause MRN: [id number] Date of Birth: 07-Mar-1971 Referring Provider (PT): Sherlyn Lees MD   Encounter Date: 01/25/2021   PT End of Session - 01/25/21 1730    Visit Number 8    Number of Visits 15    Date for PT Re-Evaluation 02/20/21    Authorization Type Medicaid healthy blue    Authorization Time Period 2/23-3/23/22 (submitted 8 more, check auth) still pending    Authorization - Visit Number 0    Authorization - Number of Visits 1    PT Start Time 6811    PT Stop Time 1813    PT Time Calculation (min) 42 min    Activity Tolerance Patient tolerated treatment well;Patient limited by pain    Behavior During Therapy [facility] for tasks assessed/performed           History reviewed. No pertinent past medical history.  History reviewed. No pertinent surgical history.  There were no vitals filed for this visit.   Subjective Assessment - 01/25/21 1739    Subjective Patient says she is hurting today. Her arm is numb and her pain in back is about an 8. Maybe from planting flowers yesterday. She feels like she has whiplash.    Pertinent History Lumbar fusion x 2    Limitations Lifting;Standing;Walking;House hold activities    How long can you stand comfortably? 2 hours    How long can you walk comfortably? 1 hour    Patient Stated Goals Pain relief, get back to function    Currently in Pain? Yes    Pain Score 8     Pain Location Back    Pain Orientation Posterior;Lower    Pain Descriptors / Indicators Aching;Sharp    Pain Type Chronic pain    Pain Onset More than a month ago    Pain Frequency Constant                             [facility] Adult PT Treatment/Exercise - 01/25/21 0001      Lumbar Exercises: Stretches   Gastroc Stretch 3 reps;30 seconds    Gastroc Stretch  Limitations slant board      Lumbar Exercises: Aerobic   Recumbent Bike 4 min lv 2 warmup      Lumbar Exercises: Standing   Heel Raises 20 reps    Other Standing Lumbar Exercises palloff press GTB 2 x10 each, palloff walkout GTB x 10 each      Knee/Hip Exercises: Standing   Hip Abduction Both;2 sets;10 reps    Hip Extension Both;2 sets;10 reps    Forward Step Up Both;2 sets;10 reps;Hand Hold: 2;Step Height: 6"    Functional Squat 1 set;10 reps      Manual Therapy   Manual Therapy Soft tissue mobilization    Manual therapy comments Performed separate from all other activity    Soft tissue mobilization IASTM to lumbar paraspinals wiht patient in prone                    PT Short Term Goals - 01/23/21 1742      PT SHORT TERM GOAL #1   Title Patient will be independent with initial HEP and self-management strategies to improve functional outcomes    Baseline Reports compliance, demos good return  Time 2    Period Weeks    Status Achieved    Target Date 01/09/21             PT Long Term Goals - 01/23/21 1743      PT LONG TERM GOAL #1   Title Patient will report at least 60% overall improvement in subjective complaint to indicate improvement in ability to perform ADLs.    Baseline Reports 30% improvement    Time 4    Period Weeks    Status On-going      PT LONG TERM GOAL #2   Title Patient will be able to ambulate at least 325 feet during 2MWT with LRAD to demonstrate improved ability to perform functional mobility and associated tasks.    Baseline Current 340    Time 4    Period Weeks    Status Achieved      PT LONG TERM GOAL #3   Title Patient will be able to stand > 15 minutes with pain not to exceed 3/10 in lumbar to improve ability to perform cooking and grooming ADLs.    Baseline Can stand >15 minutes but current average pain 4-5    Time 4    Period Weeks    Status Partially Met      PT LONG TERM GOAL #4   Title Patient will improve lumbar AROM  by 25% in all restricted planes for improved ability to perform functional mobility tasks and ADLs.    Baseline See AROM    Time 4    Period Weeks    Status Partially Met                 Plan - 01/25/21 1843    Clinical Impression Statement Patient presents with increased pain levels today. Graded activity per patient tolerance. Held ther ex progressions per increased pain level and decreased activity tolerance today. Patient noting increased RT arm discomfort holding handle with palloff press, and states she has been having more trouble with this lately. Discussed activity modifications and pacing activity to level of tolerance to avoid increased pain levels. Patient will continue to benefit from skilled therapy services to reduce limitations and improve functional ability.    Examination-Activity Limitations Locomotion Level;Bed Mobility;Lift;Stand;Sleep;Stairs;Transfers;Bend    Examination-Participation Restrictions Laundry;Community Activity;Cleaning;Yard Work    Stability/Clinical Decision Making Stable/Uncomplicated    Rehab Potential Fair    PT Frequency 2x / week    PT Duration 4 weeks    PT Treatment/Interventions ADLs/Self Care Home Management;Aquatic Therapy;Biofeedback;Canalith Repostioning;Ultrasound;Traction;Functional mobility training;Stair training;DME Instruction;Gait training;Iontophoresis 71m/ml Dexamethasone;Moist Heat;Electrical Stimulation;Contrast Bath;Therapeutic exercise;Therapeutic activities;Parrafin;Fluidtherapy;Cryotherapy;Scar mobilization;Balance training;Vestibular;Visual/perceptual remediation/compensation;Neuromuscular re-education;Passive range of motion;Dry needling;Manual techniques;Patient/family education;Manual lymph drainage;Energy conservation;Spinal Manipulations;Splinting;Joint Manipulations;Compression bandaging;Orthotic Fit/Training;Taping;Vasopneumatic Device    PT Next Visit Plan Continue with extension based progression. Add palloff twist,  lift/chops, birddogs.    PT Home Exercise Plan Eval: ab set, prone lying with pillow> prone lying flat 3/1 prone glute squeeze 3/2 seated hip abduction/ adduction, anterior pelvic tilts    Consulted and Agree with Plan of Care Patient           Patient will benefit from skilled therapeutic intervention in order to improve the following deficits and impairments:  Pain,Improper body mechanics,Impaired sensation,Increased fascial restricitons,Abnormal gait,Decreased balance,Difficulty walking,Decreased activity tolerance,Decreased range of motion,Decreased strength,Impaired perceived functional ability  Visit Diagnosis: Low back pain, unspecified back pain laterality, unspecified chronicity, unspecified whether sciatica present  Radiculopathy, lumbar region     Problem List There are no problems to display for  this patient.   6:44 PM, 01/25/21 Josue Hector PT DPT  Physical Therapist with [facility]  [phone] [place] [address] [place], [place], [address] Phone: [phone]   Fax:  [phone]  Name: Wendy Krause MRN: [id number] Date of Birth: December 31, 1970

## 2021-02-07 ENCOUNTER — Telehealth (HOSPITAL_COMMUNITY): Payer: Self-pay | Admitting: Physical Therapy

## 2021-02-07 ENCOUNTER — Ambulatory Visit (HOSPITAL_COMMUNITY): Payer: Medicaid Other | Attending: Pain Medicine | Admitting: Physical Therapy

## 2021-02-07 NOTE — Telephone Encounter (Signed)
Called patient about missed appointment. Spoke with her daughter who said patient is not feeling well and is unable to make today's visit. Gave reminder of next appointment scheduled 4/13 @ 5:30P.  6:10 PM, 02/07/21 Josue Hector PT DPT  Physical Therapist with [facility]  [phone]

## 2021-02-14 ENCOUNTER — Ambulatory Visit (HOSPITAL_COMMUNITY): Payer: Medicaid Other | Admitting: Physical Therapy

## 2021-02-14 ENCOUNTER — Telehealth (HOSPITAL_COMMUNITY): Payer: Self-pay | Admitting: Physical Therapy

## 2021-02-14 NOTE — Telephone Encounter (Signed)
Called patient about missed appointment today. 2nd no show visit. Left message about next appointment on 4/20 and that it is last scheduled appointment, to call if needing to reschedule or cancel.  6:19 PM, 02/14/21 Josue Hector PT DPT  Physical Therapist with [facility]  [phone]

## 2021-02-21 ENCOUNTER — Ambulatory Visit (HOSPITAL_COMMUNITY): Payer: Medicaid Other | Admitting: Physical Therapy

## 2021-02-21 ENCOUNTER — Encounter (HOSPITAL_COMMUNITY): Payer: Self-pay

## 2021-02-21 ENCOUNTER — Telehealth (HOSPITAL_COMMUNITY): Payer: Self-pay | Admitting: Physical Therapy

## 2021-02-21 NOTE — Telephone Encounter (Signed)
Called and spoke with patient about missed appointment today. Patient says she would like to be DC at this time, she is having several issues that she would like to discuss with her MD at next follow up appt. Encouraged patient to follow up with therapy services as needed.   6:02 PM, 02/21/21 Josue Hector PT DPT  Physical Therapist with [facility]  [phone]

## 2021-03-03 ENCOUNTER — Encounter (HOSPITAL_COMMUNITY): Payer: Self-pay

## 2021-03-03 ENCOUNTER — Emergency Department (HOSPITAL_COMMUNITY)
Admission: EM | Admit: 2021-03-03 | Discharge: 2021-03-03 | Disposition: A | Discharge status: Home / Self Care | Payer: Medicaid Other | Attending: Emergency Medicine | Admitting: Emergency Medicine

## 2021-03-03 ENCOUNTER — Other Ambulatory Visit: Payer: Self-pay

## 2021-03-03 ENCOUNTER — Emergency Department (HOSPITAL_COMMUNITY): Payer: Medicaid Other

## 2021-03-03 DIAGNOSIS — W293XXA Contact with powered garden and outdoor hand tools and machinery, initial encounter: Secondary | ICD-10-CM | POA: Insufficient documentation

## 2021-03-03 DIAGNOSIS — S6991XA Unspecified injury of right wrist, hand and finger(s), initial encounter: Secondary | ICD-10-CM | POA: Diagnosis present

## 2021-03-03 DIAGNOSIS — S61210A Laceration without foreign body of right index finger without damage to nail, initial encounter: Secondary | ICD-10-CM | POA: Insufficient documentation

## 2021-03-03 DIAGNOSIS — S61212A Laceration without foreign body of right middle finger without damage to nail, initial encounter: Secondary | ICD-10-CM

## 2021-03-03 MED ORDER — CEPHALEXIN 500 MG PO CAPS: 500.0000 mg | ORAL_CAPSULE | Freq: Four times a day (QID) | ORAL | 0 refills | Status: DC

## 2021-03-03 MED ORDER — LIDOCAINE HCL (PF) 1 % IJ SOLN
5.0000 mL | Freq: Once | INTRAMUSCULAR | Status: AC
  Administered 2021-03-03: 5 mL
  Filled 2021-03-03: qty 30

## 2021-03-03 MED ORDER — FLUCONAZOLE 150 MG PO TABS: 150.0000 mg | ORAL_TABLET | Freq: Once | ORAL | 0 refills | Status: AC

## 2021-03-03 MED ORDER — HYDROCODONE-ACETAMINOPHEN 5-325 MG PO TABS
1.0000 | ORAL_TABLET | Freq: Once | ORAL | Status: AC
  Administered 2021-03-03: 1 via ORAL
  Filled 2021-03-03: qty 1

## 2021-03-03 MED ORDER — FLUCONAZOLE 150 MG PO TABS: 150.0000 mg | ORAL_TABLET | Freq: Once | ORAL | 0 refills | Status: DC

## 2021-03-03 NOTE — Discharge Instructions (Addendum)
As discussed, your x-ray was negative for any broken bones. You will need your sutures removed in 10-14 days. I have included the number of the hand surgeon. Call Monday to schedule an appointment for further evaluation. Return to the ER for new or worsening symptoms.   Given that your laceration was dirty, I am sending you home with antibiotics. Take as prescribed and finish all antibiotics. I have also sent you home with diflucan. Take after you have finished the antibiotics.

## 2021-03-03 NOTE — ED Provider Notes (Signed)
[facility] DEPT Provider Note   CSN: [id number] Arrival date & time: 03/03/21  1812     History Chief Complaint  Patient presents with  . Finger Injury    Wendy Krause is a 50 y.o. female with no significant past medical history who presents to the ED due to laceration to right index finger. Patient notes she was using a hedge trimmer 5 hours prior to arrival when the tool kicked back and cut the palmar portion of her right index finger. Pain worse with movement of finger. No nailbed injury. No treatment prior to arrival. Last tetanus shot was last year. Patient is left hand dominant.   History obtained from patient and past medical records. No interpreter used during encounter.       History reviewed. No pertinent past medical history.  There are no problems to display for this patient.   History reviewed. No pertinent surgical history.   OB History   No obstetric history on file.     History reviewed. No pertinent family history.     Home Medications Prior to Admission medications   Not on File    Allergies    Sulfa antibiotics  Review of Systems   Review of Systems  Skin: Positive for wound.  All other systems reviewed and are negative.   Physical Exam Updated Vital Signs BP (!) 161/97   Pulse 95   Temp 98.9 F (37.2 C) (Oral)   Resp 18   LMP 01/17/2021 (Approximate)   SpO2 98%   Physical Exam Vitals and nursing note reviewed.  Constitutional:      General: She is not in acute distress.    Appearance: She is not ill-appearing.  HENT:     Head: Normocephalic.  Eyes:     Pupils: Pupils are equal, round, and reactive to light.  Cardiovascular:     Rate and Rhythm: Normal rate and regular rhythm.     Pulses: Normal pulses.     Heart sounds: Normal heart sounds. No murmur heard. No friction rub. No gallop.   Pulmonary:     Effort: Pulmonary effort is normal.     Breath sounds: Normal breath sounds.   Abdominal:     General: Abdomen is flat. There is no distension.     Palpations: Abdomen is soft.     Tenderness: There is no abdominal tenderness. There is no guarding or rebound.  Musculoskeletal:        General: Normal range of motion.     Cervical back: Neck supple.     Comments: Full ROM of all fingers and wrist.   Skin:    General: Skin is warm and dry.     Comments: 2cm laceration to right index finger on palmar aspect. Hemostasis achieved.  Neurological:     General: No focal deficit present.     Mental Status: She is alert.  Psychiatric:        Mood and Affect: Mood normal.        Behavior: Behavior normal.       ED Results / Procedures / Treatments   Labs (all labs ordered are listed, but only abnormal results are displayed) Labs Reviewed - No data to display  EKG None  Radiology DG Finger Index Right  Result Date: 03/03/2021 CLINICAL DATA:  Right index finger laceration. EXAM: RIGHT INDEX FINGER 2+V COMPARISON:  None. FINDINGS: There is no evidence of fracture or dislocation. There is no evidence of arthropathy or other focal  bone abnormality. A small superficial soft tissue defect is seen along the volar aspect of the finger, at the level of the PIP joint. IMPRESSION: Small superficial soft tissue defect without an acute osseous abnormality. Electronically Signed   By: Virgina Norfolk M.D.   On: 03/03/2021 19:27    Procedures .Marland KitchenLaceration Repair  Date/Time: 03/03/2021 8:09 PM Performed by: Suzy Bouchard, PA-C Authorized by: Suzy Bouchard, PA-C   Consent:    Consent obtained:  Verbal   Consent given by:  Patient   Risks discussed:  Infection, need for additional repair, pain, poor cosmetic result and poor wound healing   Alternatives discussed:  No treatment and delayed treatment Universal protocol:    Procedure explained and questions answered to patient or proxy's satisfaction: yes     Relevant documents present and verified: yes     Test  results available: yes     Imaging studies available: yes     Required blood products, implants, devices, and special equipment available: yes     Site/side marked: yes     Immediately prior to procedure, a time out was called: yes     Patient identity confirmed:  Verbally with patient Anesthesia:    Anesthesia method:  Local infiltration   Local anesthetic:  Lidocaine 1% w/o epi Laceration details:    Location:  Finger   Finger location:  R index finger   Length (cm):  2   Depth (mm):  3 Pre-procedure details:    Preparation:  Patient was prepped and draped in usual sterile fashion and imaging obtained to evaluate for foreign bodies Exploration:    Limited defect created (wound extended): no     Hemostasis achieved with:  Direct pressure   Imaging obtained: x-ray     Imaging outcome: foreign body not noted     Wound exploration: wound explored through full range of motion and entire depth of wound visualized     Wound extent: no foreign bodies/material noted, no tendon damage noted and no underlying fracture noted     Contaminated: yes   Treatment:    Area cleansed with:  Saline   Amount of cleaning:  Extensive   Irrigation solution:  Sterile saline   Irrigation volume:  200   Irrigation method:  Pressure wash   Visualized foreign bodies/material removed: no     Debridement:  None Skin repair:    Repair method:  Sutures   Suture size:  4-0   Suture material:  Prolene   Number of sutures:  3 Approximation:    Approximation:  Close Repair type:    Repair type:  Simple Post-procedure details:    Dressing:  Splint for protection and non-adherent dressing   Procedure completion:  Tolerated well, no immediate complications     Medications Ordered in ED Medications  lidocaine (PF) (XYLOCAINE) 1 % injection 5 mL (has no administration in time range)  HYDROcodone-acetaminophen (NORCO/VICODIN) 5-325 MG per tablet 1 tablet (1 tablet Oral Given 03/03/21 1917)    ED Course  I  have reviewed the triage vital signs and the nursing notes.  Pertinent labs & imaging results that were available during my care of the patient were reviewed by me and considered in my medical decision making (see chart for details).    MDM Rules/Calculators/A&P                         50 year old female presents to ED due to right  middle finger laceration from a hedge cutter. Tetanus updated last year. Stable vitals. 2cm laceration to palmar aspect of right middle finger with full ROM. Low suspicion for tendon involvement. X-ray ordered which demonstrates: IMPRESSION:  Small superficial soft tissue defect without an acute osseous  abnormality.   Laceration thoroughly cleaned and suture repair as noted above. Patient discharged with keflex due to contaminated wound and fluconazole due to antibiotic induced yeast infections. Hand surgery number given at discharge and instructed patient to call Monday for further evaluation. Strict ED precautions discussed with patient. Patient states understanding and agrees to plan. Patient discharged home in no acute distress and stable vitals.   Final Clinical Impression(s) / ED Diagnoses Final diagnoses:  Laceration of right middle finger without foreign body without damage to nail, initial encounter    Rx / [place] Orders ED Discharge Orders    None       Karie Kirks 03/03/21 2015    Varney Biles, MD 03/04/21 [id number]

## 2021-03-03 NOTE — ED Triage Notes (Signed)
Pt presents with c/o right index finger injury. Pt reports she cut her finger earlier today with a tool, bleeding controlled.

## 2021-05-23 ENCOUNTER — Other Ambulatory Visit: Payer: Self-pay

## 2021-05-23 ENCOUNTER — Emergency Department (HOSPITAL_COMMUNITY)
Admission: EM | Admit: 2021-05-23 | Discharge: 2021-05-24 | Disposition: A | Discharge status: Home / Self Care | Payer: Medicaid Other | Attending: Emergency Medicine | Admitting: Emergency Medicine

## 2021-05-23 DIAGNOSIS — T63001A Toxic effect of unspecified snake venom, accidental (unintentional), initial encounter: Secondary | ICD-10-CM | POA: Insufficient documentation

## 2021-05-23 DIAGNOSIS — R791 Abnormal coagulation profile: Secondary | ICD-10-CM | POA: Diagnosis not present

## 2021-05-23 DIAGNOSIS — W5911XA Bitten by nonvenomous snake, initial encounter: Secondary | ICD-10-CM | POA: Diagnosis not present

## 2021-05-23 NOTE — ED Provider Notes (Signed)
MSE was initiated and I personally evaluated the patient and placed orders (if any) at  11:22 PM on May 23, 2021.  Patient to ED after suspected snake bite that occurred around 9:00  Pm tonight. She was kneeling on the ground and felt a bite to her left thigh. Now having significant pain and swelling, with what appear to be 2 puncture wounds c/w fangs.   Today's Vitals   05/23/21 2249 05/23/21 2326 05/23/21 2327  BP: (!) 145/93    Pulse: 96    Resp: (!) 22    Temp: 98.4 F (36.9 C)    TempSrc: Oral    SpO2: 100%    Weight:   77.1 kg  Height:   5\' 4"  (1.626 m)  PainSc:  10-Worst pain ever    Body mass index is 29.18 kg/m.  Left thigh with puncture wound to anteromedial distal thigh with significant swelling and redness over the distal medial half of thigh. There is tenderness to the groin without swelling. Lower leg unremarkable.     The patient appears stable so that the remainder of the MSE may be completed by another provider.   Charlann Lange, PA-C 05/23/21 2345    Fatima Blank, MD 05/24/21 [phone]

## 2021-05-23 NOTE — ED Triage Notes (Signed)
Snake bite about 9pm tonight. Significant pain and swelling from site.

## 2021-05-23 NOTE — ED Provider Notes (Signed)
[facility] EMERGENCY DEPARTMENT Provider Note   CSN: [id number] Arrival date & time: 05/23/21  2234     History Chief Complaint  Patient presents with   Wendy Krause is a 50 y.o. female.  50 year old female with no significant past medical history presents to the emergency department for evaluation of suspected snake bite to her left thigh.  She was kneeling in the grass with a feral cat when she felt a sharp, stinging sensation to her left thigh followed by a constant burning, like her leg is "on fire".  Pain has been constant since onset at 2100.  Patient notes associated redness and swelling.  States that her son thought he saw a snake slaughtering away in the grass; reported it to be grayish or black in color.  Patient subjectively feels that she is experiencing labored breathing. Denies CP, N/V, difficulty swallowing. No medications taken PTA. Tdap UTD, last administered in 2021.  The history is provided by the patient. No language interpreter was used.      No past medical history on file.  There are no problems to display for this patient.   No past surgical history on file.   OB History   No obstetric history on file.     No family history on file.     Home Medications Prior to Admission medications   Medication Sig Start Date End Date Taking? Authorizing Provider  cephALEXin (KEFLEX) 500 MG capsule Take 1 capsule (500 mg total) by mouth 4 (four) times daily. 03/03/21   Suzy Bouchard, PA-C    Allergies    Morphine and Sulfa antibiotics  Review of Systems   Review of Systems  HENT:  Negative for trouble swallowing.   Cardiovascular:  Negative for chest pain.  Gastrointestinal:  Negative for nausea and vomiting.  Skin:  Positive for color change.  Ten systems reviewed and are negative for acute change, except as noted in the HPI.    Physical Exam Updated Vital Signs BP 103/73   Pulse 79   Temp 98.4 F (36.9  C) (Oral)   Resp 14   Ht 5\' 4"  (1.626 m)   Wt 77.1 kg   SpO2 95%   BMI 29.18 kg/m   Physical Exam Vitals and nursing note reviewed.  Constitutional:      General: She is not in acute distress.    Appearance: She is well-developed. She is not diaphoretic.     Comments: Appears uncomfortable. Nontoxic.  HENT:     Head: Normocephalic and atraumatic.  Eyes:     General: No scleral icterus.    Conjunctiva/sclera: Conjunctivae normal.  Cardiovascular:     Rate and Rhythm: Normal rate and regular rhythm.     Pulses: Normal pulses.     Comments: 2+ DP pulse in the LLE. No pallor to digits. Pulmonary:     Effort: Pulmonary effort is normal. No respiratory distress.     Breath sounds: No stridor. No wheezing.     Comments: Respirations even and unlabored Musculoskeletal:     Cervical back: Normal range of motion.       Legs:  Skin:    General: Skin is warm and dry.     Coloration: Skin is not pale.     Findings: Bruising and erythema present. No rash.  Neurological:     Mental Status: She is alert and oriented to person, place, and time.     Coordination: Coordination normal.  Comments: Decreased sensation in digits of LLE; baseline and unchanged, per patient. Able to wiggle all toes.   Psychiatric:        Behavior: Behavior normal.      ED Results / Procedures / Treatments   Labs (all labs ordered are listed, but only abnormal results are displayed) Labs Reviewed  BASIC METABOLIC PANEL - Abnormal; Notable for the following components:      Result Value   Sodium 130 (*)    Potassium 3.1 (*)    Chloride 97 (*)    Glucose, Bld 125 (*)    Creatinine, Ser 1.04 (*)    All other components within normal limits  CBC WITH DIFFERENTIAL/PLATELET - Abnormal; Notable for the following components:   WBC 10.6 (*)    RBC 3.75 (*)    Hemoglobin 10.9 (*)    HCT 33.9 (*)    All other components within normal limits  CBC WITH DIFFERENTIAL/PLATELET - Abnormal; Notable for the  following components:   WBC 10.6 (*)    RBC 3.83 (*)    Hemoglobin 10.9 (*)    HCT 34.3 (*)    All other components within normal limits  PROTIME-INR  PROTIME-INR  FIBRINOGEN  FIBRINOGEN  CBC WITH DIFFERENTIAL/PLATELET  PROTIME-INR  FIBRINOGEN  I-STAT BETA HCG BLOOD, ED ([facility], [facility], [facility] ONLY)    EKG None  Radiology DG Femur 1 View Left  Result Date: 05/24/2021 CLINICAL DATA:  Snake bite, left leg pain and swelling EXAM: LEFT FEMUR 1 VIEW COMPARISON:  None. FINDINGS: There is no evidence of fracture or other focal bone lesions. Soft tissues are unremarkable. No retained radiopaque foreign body. IMPRESSION: Negative. Electronically Signed   By: Fidela Salisbury MD   On: 05/24/2021 00:35    Procedures .Critical Care  Date/Time: 05/24/2021 5:07 AM Performed by: Antonietta Breach, PA-C Authorized by: Antonietta Breach, PA-C   Critical care provider statement:    Critical care time (minutes):  45   Critical care was necessary to treat or prevent imminent or life-threatening deterioration of the following conditions: Snake bite.   Critical care was time spent personally by me on the following activities:  Discussions with consultants, evaluation of patient's response to treatment, examination of patient, ordering and performing treatments and interventions, ordering and review of laboratory studies, ordering and review of radiographic studies, pulse oximetry, re-evaluation of patient's condition, obtaining history from patient or surrogate and review of old charts   Medications Ordered in ED Medications  oxyCODONE-acetaminophen (PERCOCET/ROXICET) 5-325 MG per tablet 2 tablet (has no administration in time range)  oxyCODONE-acetaminophen (PERCOCET/ROXICET) 5-325 MG per tablet 2 tablet (2 tablets Oral Given 05/24/21 0030)  ketorolac (TORADOL) 15 MG/ML injection 15 mg (15 mg Intravenous Given 05/24/21 0315)  fentaNYL (SUBLIMAZE) injection 50 mcg (50 mcg Intravenous Given 05/24/21 0315)    ED Course  I  have reviewed the triage vital signs and the nursing notes.  Pertinent labs & imaging results that were available during my care of the patient were reviewed by me and considered in my medical decision making (see chart for details).  Clinical Course as of 05/24/21 0510  Wed May 23, 2021  2355 Site re-marked and thigh remeasured. Will reassess at [place].  [KH]  Thu May 24, 2021  0001 Spoke with Chong Sicilian at [place] who advised the following:  No ice or tourniquet. Update Tdap, basic wound care.  Measure three separate areas; at bite site, one below bite site, and one above bite site. Measure opposite thigh  for comparison. Labs at 6 hours post-bite (PT/INR, CBC, fibrinogen); baseline labs not needed. Compartment syndrome is rare. Try to keep limb elevated.  Criteria for CroFab: gross swelling esp if compromising circulation. Ideally initiated within 6 hours, but can be administered up to 24 hours out from the bite. Not anticipated to be needed in this case. Call back with questions or concerns. [KH]  0050 Repeat measurements: 20.25" above bite 18" at bite site 17" at knee [KH]  0204 Repeat measurements: 20.5" above bite 19" at bite site 15.5" at knee [KH]  0324 6 hour post bite labs drawn. [KH]  0435 PT/INR and fibrinogen stable; normal and unchanged. Pending CBC. [KH]  0456 Repeat CBC is unchanged fo prior.  [KH]  0506 On reassessment, patient sleeping. Bite site appears to be improving. No extension beyond last marking at 0200. [KH]    Clinical Course User Index [KH] Beverely Pace   MDM Rules/Calculators/A&P                           50 year old female presents to the emergency department following a snakebite which occurred at 2100.  She has been observed in the emergency department for over 6 hours.  At most recent reassessment, swelling appears to be improving.  There is no progression of erythema, edema for the last 3 hours.  Patient remains neurovascularly intact.   Does not meet criteria for administration of CroFab.  Labs stable at 6 hours post snake bite.  Pain is well controlled with Percocet.  Stable for discharge, though have encouraged return for acute symptomatic worsening.  Patient discharged in stable condition with no unaddressed concerns.   Final Clinical Impression(s) / ED Diagnoses Final diagnoses:  Snake bite, initial encounter    Rx / DC Orders ED Discharge Orders     None        Antonietta Breach, PA-C 05/24/21 0510    Maudie Flakes, MD 05/24/21 [phone]

## 2021-05-24 ENCOUNTER — Emergency Department (HOSPITAL_COMMUNITY): Payer: Medicaid Other

## 2021-05-24 ENCOUNTER — Other Ambulatory Visit: Payer: Self-pay

## 2021-05-24 ENCOUNTER — Emergency Department (HOSPITAL_COMMUNITY)
Admission: EM | Admit: 2021-05-24 | Discharge: 2021-05-25 | Disposition: A | Discharge status: Home / Self Care | Payer: Medicaid Other | Source: Home / Self Care | Attending: Emergency Medicine | Admitting: Emergency Medicine

## 2021-05-24 DIAGNOSIS — W5911XD Bitten by nonvenomous snake, subsequent encounter: Secondary | ICD-10-CM

## 2021-05-24 DIAGNOSIS — R791 Abnormal coagulation profile: Secondary | ICD-10-CM | POA: Insufficient documentation

## 2021-05-24 DIAGNOSIS — S7012XA Contusion of left thigh, initial encounter: Secondary | ICD-10-CM | POA: Insufficient documentation

## 2021-05-24 LAB — CBC WITH DIFFERENTIAL/PLATELET
Abs Immature Granulocytes: 0.04 10*3/uL (ref 0.00–0.07)
Abs Immature Granulocytes: 0.05 10*3/uL (ref 0.00–0.07)
Abs Immature Granulocytes: 0.05 10*3/uL (ref 0.00–0.07)
Basophils Absolute: 0 10*3/uL (ref 0.0–0.1)
Basophils Absolute: 0.1 10*3/uL (ref 0.0–0.1)
Basophils Absolute: 0.1 10*3/uL (ref 0.0–0.1)
Basophils Relative: 0 %
Basophils Relative: 1 %
Basophils Relative: 1 %
Eosinophils Absolute: 0.2 10*3/uL (ref 0.0–0.5)
Eosinophils Absolute: 0.2 10*3/uL (ref 0.0–0.5)
Eosinophils Absolute: 0.2 10*3/uL (ref 0.0–0.5)
Eosinophils Relative: 2 %
Eosinophils Relative: 2 %
Eosinophils Relative: 2 %
HCT: 33.9 % — ABNORMAL LOW (ref 36.0–46.0)
HCT: 34.3 % — ABNORMAL LOW (ref 36.0–46.0)
HCT: 35.9 % — ABNORMAL LOW (ref 36.0–46.0)
Hemoglobin: 10.9 g/dL — ABNORMAL LOW (ref 12.0–15.0)
Hemoglobin: 10.9 g/dL — ABNORMAL LOW (ref 12.0–15.0)
Hemoglobin: 11.4 g/dL — ABNORMAL LOW (ref 12.0–15.0)
Immature Granulocytes: 0 %
Immature Granulocytes: 1 %
Immature Granulocytes: 1 %
Lymphocytes Relative: 17 %
Lymphocytes Relative: 19 %
Lymphocytes Relative: 19 %
Lymphs Abs: 2 10*3/uL (ref 0.7–4.0)
Lymphs Abs: 2 10*3/uL (ref 0.7–4.0)
Lymphs Abs: 2.1 10*3/uL (ref 0.7–4.0)
MCH: 28.5 pg (ref 26.0–34.0)
MCH: 28.6 pg (ref 26.0–34.0)
MCH: 29.1 pg (ref 26.0–34.0)
MCHC: 31.8 g/dL (ref 30.0–36.0)
MCHC: 31.8 g/dL (ref 30.0–36.0)
MCHC: 32.2 g/dL (ref 30.0–36.0)
MCV: 89.6 fL (ref 80.0–100.0)
MCV: 90 fL (ref 80.0–100.0)
MCV: 90.4 fL (ref 80.0–100.0)
Monocytes Absolute: 0.8 10*3/uL (ref 0.1–1.0)
Monocytes Absolute: 0.9 10*3/uL (ref 0.1–1.0)
Monocytes Absolute: 0.9 10*3/uL (ref 0.1–1.0)
Monocytes Relative: 8 %
Monocytes Relative: 8 %
Monocytes Relative: 9 %
Neutro Abs: 7.4 10*3/uL (ref 1.7–7.7)
Neutro Abs: 7.5 10*3/uL (ref 1.7–7.7)
Neutro Abs: 8.1 10*3/uL — ABNORMAL HIGH (ref 1.7–7.7)
Neutrophils Relative %: 68 %
Neutrophils Relative %: 69 %
Neutrophils Relative %: 73 %
Platelets: 243 10*3/uL (ref 150–400)
Platelets: 265 10*3/uL (ref 150–400)
Platelets: 295 10*3/uL (ref 150–400)
RBC: 3.75 MIL/uL — ABNORMAL LOW (ref 3.87–5.11)
RBC: 3.83 MIL/uL — ABNORMAL LOW (ref 3.87–5.11)
RBC: 3.99 MIL/uL (ref 3.87–5.11)
RDW: 13.9 % (ref 11.5–15.5)
RDW: 13.9 % (ref 11.5–15.5)
RDW: 14 % (ref 11.5–15.5)
WBC: 10.6 10*3/uL — ABNORMAL HIGH (ref 4.0–10.5)
WBC: 10.6 10*3/uL — ABNORMAL HIGH (ref 4.0–10.5)
WBC: 11.2 10*3/uL — ABNORMAL HIGH (ref 4.0–10.5)
nRBC: 0 % (ref 0.0–0.2)
nRBC: 0 % (ref 0.0–0.2)
nRBC: 0 % (ref 0.0–0.2)

## 2021-05-24 LAB — FIBRINOGEN
Fibrinogen: 313 mg/dL (ref 210–475)
Fibrinogen: 363 mg/dL (ref 210–475)
Fibrinogen: 369 mg/dL (ref 210–475)

## 2021-05-24 LAB — BASIC METABOLIC PANEL
Anion gap: 5 (ref 5–15)
Anion gap: 8 (ref 5–15)
BUN: 11 mg/dL (ref 6–20)
BUN: 9 mg/dL (ref 6–20)
CO2: 25 mmol/L (ref 22–32)
CO2: 26 mmol/L (ref 22–32)
Calcium: 9.2 mg/dL (ref 8.9–10.3)
Calcium: 9.3 mg/dL (ref 8.9–10.3)
Chloride: 102 mmol/L (ref 98–111)
Chloride: 97 mmol/L — ABNORMAL LOW (ref 98–111)
Creatinine, Ser: 0.91 mg/dL (ref 0.44–1.00)
Creatinine, Ser: 1.04 mg/dL — ABNORMAL HIGH (ref 0.44–1.00)
GFR, Estimated: >60 mL/min (ref >60)
GFR, Estimated: >60 mL/min (ref >60)
Glucose, Bld: 111 mg/dL — ABNORMAL HIGH (ref 70–99)
Glucose, Bld: 125 mg/dL — ABNORMAL HIGH (ref 70–99)
Potassium: 3.1 mmol/L — ABNORMAL LOW (ref 3.5–5.1)
Potassium: 3.7 mmol/L (ref 3.5–5.1)
Sodium: 130 mmol/L — ABNORMAL LOW (ref 135–145)
Sodium: 133 mmol/L — ABNORMAL LOW (ref 135–145)

## 2021-05-24 LAB — PROTIME-INR
INR: 1 (ref 0.8–1.2)
INR: 1 (ref 0.8–1.2)
INR: 1 (ref 0.8–1.2)
Prothrombin Time: 13.2 seconds (ref 11.4–15.2)
Prothrombin Time: 13.6 seconds (ref 11.4–15.2)
Prothrombin Time: 13.6 seconds (ref 11.4–15.2)

## 2021-05-24 LAB — I-STAT BETA HCG BLOOD, ED (MC, WL, AP ONLY): I-stat hCG, quantitative: <5 m[IU]/mL (ref <5)

## 2021-05-24 MED ORDER — KETOROLAC TROMETHAMINE 15 MG/ML IJ SOLN
15.0000 mg | Freq: Once | INTRAMUSCULAR | Status: AC
  Administered 2021-05-24: 15 mg via INTRAVENOUS
  Filled 2021-05-24: qty 1

## 2021-05-24 MED ORDER — FENTANYL CITRATE (PF) 100 MCG/2ML IJ SOLN
50.0000 ug | Freq: Once | INTRAMUSCULAR | Status: AC
  Administered 2021-05-24: 50 ug via INTRAVENOUS
  Filled 2021-05-24: qty 2

## 2021-05-24 MED ORDER — OXYCODONE-ACETAMINOPHEN 5-325 MG PO TABS
2.0000 | ORAL_TABLET | Freq: Once | ORAL | Status: AC
  Administered 2021-05-24: 2 via ORAL
  Filled 2021-05-24: qty 2

## 2021-05-24 NOTE — ED Notes (Signed)
Poison Control Recommendations:  No ice or tourniquet. Update Tdap, basic wound care.    Measure three separate areas; at bite site, one below bite site, and one above bite site. Measure opposite thigh for comparison. Labs at 6 hours post-bite (PT/INR, CBC, fibrinogen); baseline labs not needed. Compartment syndrome is rare. Try to keep limb elevated.    Criteria for CroFab: gross swelling esp if compromising circulation. Ideally initiated within 6 hours, but can be administered up to 24 hours out from the bite. Not anticipated to be needed in this case. Call backwith questions or concerns

## 2021-05-24 NOTE — ED Notes (Signed)
Patient verbalizes understanding of discharge instructions. Opportunity for questioning and answers were provided. Armband removed by staff, pt discharged from ED via wheelchair.  

## 2021-05-24 NOTE — ED Notes (Signed)
Measurements:   Left leg (snake bite): Above: 20 1/4 inches On site: 18 inches  Below (over knee): 17 inches  Right leg: Around the same area the bite is on the left leg. Measuring 16 1/2 inches

## 2021-05-24 NOTE — Discharge Instructions (Addendum)
Continue taking your prescribed pain medications.  Return to the emergency department if symptoms worsen.

## 2021-05-24 NOTE — ED Triage Notes (Signed)
Pt states she was bitten by  a snake last night. Pt reports she came here to the ED last night and was seen and given pain meds and monitored. Pt states the swelling and pain has increased.

## 2021-05-24 NOTE — ED Provider Notes (Signed)
Emergency Medicine Provider Triage Evaluation Note  Berneda Novalie Kronberg , a 50 y.o. female  was evaluated in triage.  Pt complains of L thigh swelling 2/2 snake bite at 2100 yesterday. Was seen in the ED and discharged after 6 hours of observation with symptomatic improvement, stable labs.  States swelling has worsened today. Pain remains constant, traveling into groin. Difficult to fully extend knee due to swelling and discomfort. States she was in bed most of the day trying to keep the extremity elevated.  Review of Systems  Positive: LLE pain, swelling Negative: Fever  Physical Exam  BP (!) 133/112 (BP Location: Left Arm)   Pulse (!) 113   Temp 98.5 F (36.9 C) (Oral)   Resp 20   Ht '5\' 4"'$  (1.626 m)   Wt 77.1 kg   SpO2 100%   BMI 29.18 kg/m  Gen:   Awake, no distress   Resp:  Normal effort  MSK:   Moves extremities without difficulty  Other:  Erythema and edema extends through much of the L thigh. Increased ecchymosis. DP pulse 2+ in the LLE.   Medical Decision Making  Medically screening exam initiated at 10:57 PM.  Appropriate orders placed.  Elmo was informed that the remainder of the evaluation will be completed by another provider, this initial triage assessment does not replace that evaluation, and the importance of remaining in the ED until their evaluation is complete.  Snake bite, subsequent encounter   Antonietta Breach, Hershal Coria 05/24/21 [place], [facility], MD 05/25/21 [phone]

## 2021-05-25 MED ORDER — OXYCODONE-ACETAMINOPHEN 5-325 MG PO TABS
1.0000 | ORAL_TABLET | Freq: Once | ORAL | Status: AC
Start: 2021-05-25 — End: 2021-05-25
  Administered 2021-05-25: 1 via ORAL
  Filled 2021-05-25: qty 1

## 2021-05-25 MED ORDER — FENTANYL CITRATE (PF) 100 MCG/2ML IJ SOLN
100.0000 ug | Freq: Once | INTRAMUSCULAR | Status: AC
  Administered 2021-05-25: 100 ug via INTRAVENOUS
  Filled 2021-05-25: qty 2

## 2021-05-25 MED ORDER — ONDANSETRON HCL 4 MG/2ML IJ SOLN
4.0000 mg | Freq: Once | INTRAMUSCULAR | Status: AC
  Administered 2021-05-25: 4 mg via INTRAVENOUS
  Filled 2021-05-25: qty 2

## 2021-05-25 NOTE — ED Notes (Signed)
Pt's leg elevated.

## 2021-05-25 NOTE — ED Provider Notes (Signed)
Emergency Department Provider Note   I have reviewed the triage vital signs and the nursing notes.   HISTORY  Chief Complaint Snake Bite   HPI Wendy Krause is a 50 y.o. female with past history reviewed below returns to the emergency department with worsening pain in the left thigh after a snakebite.  The bite occurred at 9 PM yesterday.  She came to the emergency department where she was monitored.  The case was discussed with poison control and ultimately patient deemed to not be a good CroFab candidate.  She was discharged home with pain medication and states that her leg has become more swollen and it is hurting more.  She is having difficulty sleeping.  At the poison control team apparently called her back and she told about her symptoms and they advised that she return to the emergency department for reevaluation.  She has some baseline numbness in the left foot which is unchanged.  No color change of the foot.  No injury.  No chest pain or shortness of breath symptoms.  No past medical history on file.  There are no problems to display for this patient.   No past surgical history on file.  Allergies Morphine and Sulfa antibiotics  No family history on file.  Social History    Review of Systems  Constitutional: No fever/chills Eyes: No visual changes. ENT: No sore throat. Cardiovascular: Denies chest pain. Respiratory: Denies shortness of breath. Gastrointestinal: No abdominal pain.  No nausea, no vomiting.  No diarrhea.  No constipation. Genitourinary: Negative for dysuria. Musculoskeletal: Negative for back pain. Positive left thigh pain and swelling.  Skin: Negative for rash. Bruising to the left thigh.  Neurological: Negative for headaches, focal weakness or numbness.  10-point ROS otherwise negative.  ____________________________________________   PHYSICAL EXAM:  VITAL SIGNS: ED Triage Vitals  Enc Vitals Group     BP 05/24/21 2250 (!) 133/112      Pulse Rate 05/24/21 2250 (!) 113     Resp 05/24/21 2250 20     Temp 05/24/21 2250 98.5 F (36.9 C)     Temp Source 05/24/21 2250 Oral     SpO2 05/24/21 2250 100 %     Weight 05/24/21 2254 170 lb (77.1 kg)     Height 05/24/21 2254 '5\' 4"'$  (1.626 m)   Constitutional: Alert and oriented. Well appearing and in no acute distress. Eyes: Conjunctivae are normal.  Head: Atraumatic. Nose: No congestion/rhinnorhea. Mouth/Throat: Mucous membranes are moist.   Neck: No stridor.   Cardiovascular: Normal rate, regular rhythm. Good peripheral circulation. Grossly normal heart sounds.   Respiratory: Normal respiratory effort.  No retractions. Lungs CTAB. Gastrointestinal: Soft and nontender. No distention.  Musculoskeletal: There is edema throughout the left thigh worse in comparison to the right.  The boundaries of bruising and swelling are noted to the thigh drawn from yesterday.  The bruising extends medially.  The area is focally tender but the thigh overall is not consistent with compartment syndrome.  There is no tight/tense area in the calf or anterior left leg.  Patient has normal pulses in the left foot both DP/PT pulses.  Some baseline sensory deficit in the left foot but no new sensory deficit. Neurologic:  Normal speech and language. No gross focal neurologic deficits are appreciated.  Skin:  Skin is warm, dry and intact. Bruising as noted above.   ____________________________________________   LABS (all labs ordered are listed, but only abnormal results are displayed)  Labs Reviewed  CBC WITH DIFFERENTIAL/PLATELET - Abnormal; Notable for the following components:      Result Value   WBC 11.2 (*)    Hemoglobin 11.4 (*)    HCT 35.9 (*)    Neutro Abs 8.1 (*)    All other components within normal limits  BASIC METABOLIC PANEL - Abnormal; Notable for the following components:   Sodium 133 (*)    Glucose, Bld 111 (*)    All other components within normal limits  PROTIME-INR   FIBRINOGEN    ____________________________________________   PROCEDURES  Procedure(s) performed:   Procedures  None ____________________________________________   INITIAL IMPRESSION / ASSESSMENT AND PLAN / ED COURSE  Pertinent labs & imaging results that were available during my care of the patient were reviewed by me and considered in my medical decision making (see chart for details).   Patient presents to the emergency department for reevaluation of snake bite wound to the left thigh.  The exam is not consistent with compartment syndrome.  Spoke with [employer]. Case reviewed by the Toxicologist. No CroFab indicated. Labs including Firbinogen, INR, and platelets are reassuring. Plan for continued pain mgmt at home and close PCP follow up. Patient under pain contract but her provider has called in additional meds at home. She is feeling better after fentanyl. No change in leg exam on reassessment. Discussed ED return precautions.  ____________________________________________  FINAL CLINICAL IMPRESSION(S) / ED DIAGNOSES  Final diagnoses:  Snake bite, subsequent encounter    MEDICATIONS GIVEN DURING THIS VISIT:  Medications  fentaNYL (SUBLIMAZE) injection 100 mcg (100 mcg Intravenous Given 05/25/21 0112)  ondansetron (ZOFRAN) injection 4 mg (4 mg Intravenous Given 05/25/21 0112)  oxyCODONE-acetaminophen (PERCOCET/ROXICET) 5-325 MG per tablet 1 tablet (1 tablet Oral Given 05/25/21 0300)    Note:  This document was prepared using Dragon voice recognition software and may include unintentional dictation errors.  Nanda Quinton, MD, [facility] Emergency Medicine    Dynasti Kerman, [facility], MD 05/25/21 [phone]

## 2021-05-25 NOTE — Discharge Instructions (Addendum)
Return to the ED with any new or worsening symptoms including severe pain, numbness in the legs, or color change to the foot.

## 2021-06-08 ENCOUNTER — Other Ambulatory Visit: Payer: Self-pay

## 2021-06-08 ENCOUNTER — Ambulatory Visit (HOSPITAL_COMMUNITY): Payer: Medicare Other | Attending: Hand Surgery

## 2021-06-08 ENCOUNTER — Encounter (HOSPITAL_COMMUNITY): Payer: Self-pay

## 2021-06-08 DIAGNOSIS — M25642 Stiffness of left hand, not elsewhere classified: Secondary | ICD-10-CM

## 2021-06-08 DIAGNOSIS — M25641 Stiffness of right hand, not elsewhere classified: Secondary | ICD-10-CM | POA: Diagnosis present

## 2021-06-08 NOTE — Patient Instructions (Signed)
THUMB AND CARPAL LIGAMENT STRETCH   Place your opposite hand behind the target hand. Use the thumb in the back to hook the thumb of the target hand. Pull down for a gentle stretch as shown. Hold for 20-30 seconds. Complete 2 times.     Thumb opposition stretch  Injured thumb pointed towards the little finger, with opposite hand put thumb on big joint and index finger on the last part of thumb, slowly bend the injured thumb towards the little finger.    Hold 20-30 seconds. Complete 2 times.     ICE MASSAGE TO WRIST / CARPAL TUNNEL  Place direct ice from an ice massage cup to the front of your wrist (carpal tunnel area). Move the ice in a circular motion for up to 5 minutes (no more). Use towels to catch the water drippings.   You should feel 4 stages of sensations starting with... 1. Uncomfortable sensation of cold, then 2. Stinging, then 3. Burning or aching feeling, then 4. Numbness  If the pain is too great to handle, lift it off your skin for a few seconds, dab with towel and then place it back on for a few circular motions and repeat.  Do not perform for more than 5 minutes or you may run the risk of frost bite and cause death to the tissue. Use a timer to be safe.

## 2021-06-08 NOTE — Therapy (Signed)
[facility] [address] [place], [place], [address] Phone: [phone]   Fax:  [phone]  Occupational Therapy Evaluation  Patient Details  Name: Wendy Krause MRN: [id number] Date of Birth: September 04, 1971 Referring Provider (OT): Rosalee Kaufman PA-C   Encounter Date: 06/08/2021   OT End of Session - 06/08/21 1202     Visit Number 1    Number of Visits 1    Authorization Type [place] Medicaid Health Blue    Authorization Time Period $3.00 copay    OT Start Time 1130   pt arrived late   OT Stop Time 1155    OT Time Calculation (min) 25 min    Activity Tolerance Patient tolerated treatment well    Behavior During Therapy [facility] for tasks assessed/performed             History reviewed. No pertinent past medical history.  History reviewed. No pertinent surgical history.  There were no vitals filed for this visit.   Subjective Assessment - 06/08/21 1154     Subjective  S: I am doing really great overall. I'm probably doing too much with my hands but I trying not to. I have just a little bit of swelling still.    Pertinent History Patient is a 50 y/o female S/P bilateral carpal tunnel release and bilateral trigger thumb release. Right hand was completed on 05/01/21 and left hand was completed on 05/08/21. Brittni Cyndie Mull has referred patient to occupational therapy eval and treat.    Patient Stated Goals To be able to continue using her hands for all daily tasks and gain more ROM in her thumbs if able.    Currently in Pain? No/denies               [facility] OT Assessment - 06/08/21 1157       Assessment   Medical Diagnosis bilateral CT release and trigger thumb release    Referring Provider (OT) Brittni Marcello Moores PA-C    Onset Date/Surgical Date --   05/01/21 (right) 05/08/21 (left)   Hand Dominance Left    Prior Therapy Earlier this year she was receiving PT services at this clinic for low back pain. She had to stop to treat her  hands.      Precautions   Precautions None      Restrictions   Weight Bearing Restrictions No      Balance Screen   Has the patient fallen in the past 6 months No      Home  Environment   Family/patient expects to be discharged to: Private residence      Prior Function   Level of Independence Independent      ADL   ADL comments Difficulty weightbearing into both hands when pushing up from a chair to stand.      Mobility   Mobility Status Independent      Written Expression   Dominant Hand Left      Vision - History   Baseline Vision No visual deficits      Cognition   Overall Cognitive Status Within Functional Limits for tasks assessed      Observation/Other Assessments   Focus on Therapeutic Outcomes (FOTO)  N/A      Sensation   Light Touch Appears Intact    Stereognosis Appears Intact    Hot/Cold Appears Intact    Proprioception Appears Intact      Coordination   Gross Motor Movements are Fluid and Coordinated Yes  Fine Motor Movements are Fluid and Coordinated Yes      ROM / Strength   AROM / PROM / Strength AROM;Strength      AROM   Overall AROM  Within functional limits for tasks performed    Overall AROM Comments Bilateral thumb MCP joints do present with some joint tightness which prevent them from achieving full range although able to demonstrate functional ROM.      Strength   Overall Strength Within functional limits for tasks performed    Overall Strength Comments gross hand and wrist strength bilaterally                             OT Education - 06/08/21 1201     Education Details encouraged use of built handle gardening tools to help decrease the amount of force to the hand joints when gripping/squeezing. Ice massage to MCP joints and wrists as needed. Thumb flexion and extension stretches.    Person(s) Educated Patient    Methods Explanation;Demonstration;Handout    Comprehension Returned demonstration;Verbalized  understanding                        Plan - 06/08/21 1203     Clinical Impression Statement A: Patient is a 50 y/o female presenting to OT post op bilateral CT release and trigger thumb release with very mild swelling and ROM of the thumb MCP joints. Pt has overall been doing very well. She is completing all daily tasks that she needs to complete. She does experience some soreness when attempting to weightbear into hands when transitioning from sitting to standing which is to be expected. She was provided with a HEP with thumb stretches and education on completing an Ice massage if needed. Patient verbalized understanding  of HEP.    OT Occupational Profile and History Problem Focused Assessment - Including review of records relating to presenting problem    Occupational performance deficits (Please refer to evaluation for details): ADL's;Leisure;IADL's    Body Structure / Function / Physical Skills ROM;Edema;Fascial restriction    Rehab Potential Excellent    Clinical Decision Making Limited treatment options, no task modification necessary    Comorbidities Affecting Occupational Performance: None    Modification or Assistance to Complete Evaluation  No modification of tasks or assist necessary to complete eval    OT Frequency One time visit    OT Treatment/Interventions Other (comment)   evaluation only   Plan P: One time visit to establish HEP. Patient does not require any additional follow up OT services at this time.    OT Home Exercise Plan see education section    Consulted and Agree with Plan of Care Patient             Patient will benefit from skilled therapeutic intervention in order to improve the following deficits and impairments:   Body Structure / Function / Physical Skills: ROM, Edema, Fascial restriction       Visit Diagnosis: Stiffness of left hand, not elsewhere classified  Stiffness of right hand, not elsewhere classified    Problem  List There are no problems to display for this patient.   Ailene Ravel, OTR/L,CBIS  [phone]  06/08/2021, 12:17 PM  [facility] [address] [place], [place], [address] Phone: [phone]   Fax:  [phone]  Name: Wendy Krause MRN: [id number] Date of Birth: 07-18-1971

## 2021-06-15 NOTE — Addendum Note (Signed)
Addended by: Ailene Ravel D on: 06/15/2021 10:44 AM   Modules accepted: Orders

## 2021-11-04 DIAGNOSIS — Z9989 Dependence on other enabling machines and devices: Secondary | ICD-10-CM

## 2021-11-04 DIAGNOSIS — C801 Malignant (primary) neoplasm, unspecified: Secondary | ICD-10-CM

## 2021-11-04 HISTORY — DX: Malignant (primary) neoplasm, unspecified: C80.1

## 2021-11-04 HISTORY — DX: Dependence on other enabling machines and devices: Z99.89

## 2022-01-21 IMAGING — CR DG FINGER INDEX 2+V*R*
3 series · 3 of 3 positions shown · non-contrast
Comparison: None.

CLINICAL DATA: Right index finger laceration.

EXAM:
RIGHT INDEX FINGER 2+V

[x finger pa right]
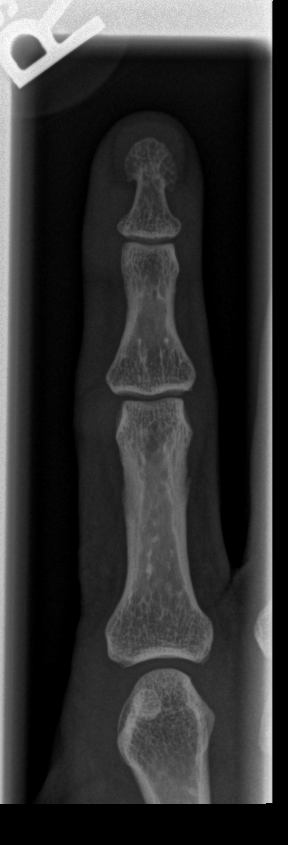

[x finger obl right]
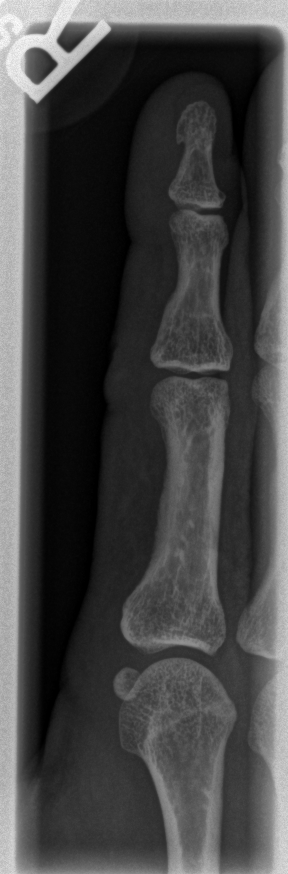

[x finger lat right]
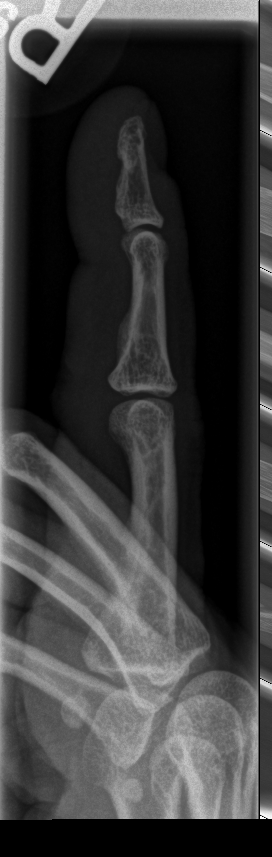

[3 of 3 positions shown; findings below may reference images not displayed]

FINDINGS: There is no evidence of fracture or dislocation. There is no
evidence of arthropathy or other focal bone abnormality. A small
superficial soft tissue defect is seen along the volar aspect of the
finger, at the level of the PIP joint.
IMPRESSION: Small superficial soft tissue defect without an acute osseous
abnormality.

## 2022-03-11 ENCOUNTER — Encounter (HOSPITAL_BASED_OUTPATIENT_CLINIC_OR_DEPARTMENT_OTHER): Payer: Self-pay | Admitting: Obstetrics and Gynecology

## 2022-03-11 NOTE — Progress Notes (Signed)
03/11/2022 ?2:16 PM ?Spoke w/ via phone for pre-op interview---patient ?Lab needs dos---- none              ?Lab results------ ?COVID test -----patient states asymptomatic no test needed ?Arrive at -------0530 ?NPO after MN NO Solid Food.  Clear liquids from MN until---0330 ?Med rec completed ?Medications to take morning of surgery -----none ?Diabetic medication -----n/a ?Patient instructed no nail polish to be worn day of surgery ?Patient instructed to bring photo id and insurance card day of surgery ?Patient aware to have Driver (ride ) / caregiver    for 24 hours after surgery home with boyfriend Freddi Starr ?Patient Special Instructions -----none  ?Pre-Op special Istructions -----none ?Patient verbalized understanding of instructions that were given at this phone interview. ?Patient denies shortness of breath, chest pain, fever, cough at this phone interview. ?Lanier Millon, Arville Lime ? ? ?

## 2022-03-13 NOTE — H&P (Signed)
Wendy Krause is an 51 y.o. female. 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 ? ?Pertinent Gynecological History: ?Menses: flow is moderate ?Bleeding:  ?Contraception: tubal ligation ?DES exposure: denies ?Blood transfusions: none ?Sexually transmitted diseases: no past history ?Previous GYN Procedures:  ?Last mammogram: normal Date: 1/23 ?Last pap: abnormal: see above  Date: 3/23 ?OB History: G5, P4  ? ?Menstrual History: ?Menarche age: unknown ?Patient's last menstrual period was 02/04/2022 (approximate). ?  ? ?Past Medical History:  ?Diagnosis Date  ? Anemia   ? Neuropathy   ? left knee  ? Sciatica   ? ? ?Past Surgical History:  ?Procedure Laterality Date  ? BILATERAL CARPAL TUNNEL RELEASE  2022  ? CESAREAN SECTION    ? 2008  ? CESAREAN SECTION    ? 2012  ? KNEE SURGERY Left   ? 51 years old  ? LAMINECTOMY  2020  ? L5-S1  ? LUMBAR FUSION  01/2020  ? LUMBAR FUSION  03/01/2020  ? surgery redone per patient  ? TONSILLECTOMY    ? ? ?History reviewed. No pertinent family history. ? ?Social History:  reports that she has been smoking cigarettes. She has a 15.00 pack-year smoking history. She has never used smokeless tobacco. She reports current drug use. Drug: Other-see comments. She reports that she does not drink alcohol. ? ?Allergies:  ?Allergies  ?Allergen Reactions  ? Morphine   ?  Other reaction(s): Agitation ?Combative  ? Sulfa Antibiotics   ?  Other reaction(s): Other ?blisters  ? ? ?No medications prior to admission.  ? ? ?Review of Systems  ?Constitutional:  Negative for fever.  ? ?Height '5\' 4"'$  (1.626 m), weight 82.1 kg, last menstrual period 02/04/2022. ?Physical Exam ?Cardiovascular:  ?   Rate and Rhythm: Normal rate.  ?Pulmonary:  ?   Effort: Pulmonary effort is normal.  ? ? ?No results found for this or any previous visit (from the past 24 hour(s)). ? ?No results found. ? ?Assessment/Plan: ?51 yo G5P4 with high grade Pap and discordance  with low grade cervical biopsies ?CKC planned. Risks reiviewed including infection, organ damage, bleeding/transfusion - HIV/Hep DVT/PE, pneumonia. ? ?Wendy Krause ?03/13/2022, 4:24 PM ? ?

## 2022-03-14 NOTE — Anesthesia Preprocedure Evaluation (Addendum)
Anesthesia Evaluation  ?Patient identified by MRN, date of birth, ID band ?Patient awake ? ? ? ?Reviewed: ?Allergy & Precautions, NPO status , Patient's Chart, lab work & pertinent test results ? ?History of Anesthesia Complications ?Negative for: history of anesthetic complications ? ?Airway ?Mallampati: II ? ?TM Distance: >3 FB ?Neck ROM: Full ? ? ? Dental ? ?(+) Dental Advisory Given ?  ?Pulmonary ?Current SmokerPatient did not abstain from smoking.,  ?  ?Pulmonary exam normal ? ? ? ? ? ? ? Cardiovascular ?negative cardio ROS ?Normal cardiovascular exam ? ? ?  ?Neuro/Psych ? Neuromuscular disease negative psych ROS  ? GI/Hepatic ?negative GI ROS, Neg liver ROS,   ?Endo/Other  ? ?Obesity ? ? Renal/GU ?negative Renal ROS  ? ?  ?Musculoskeletal ?negative musculoskeletal ROS ?(+)  ? Abdominal ?  ?Peds ? Hematology ?negative hematology ROS ?(+)   ?Anesthesia Other Findings ?Chronic pain ? ? Reproductive/Obstetrics ? ?  ? ? ? ? ? ? ? ? ? ? ? ? ? ?  ?  ? ? ? ? ? ? ? ?Anesthesia Physical ?Anesthesia Plan ? ?ASA: 2 ? ?Anesthesia Plan: General  ? ?Post-op Pain Management: Tylenol PO (pre-op)*  ? ?Induction: Intravenous ? ?PONV Risk Score and Plan: 3 and Treatment may vary due to age or medical condition, Ondansetron, Dexamethasone and Midazolam ? ?Airway Management Planned: LMA ? ?Additional Equipment: None ? ?Intra-op Plan:  ? ?Post-operative Plan: Extubation in OR ? ?Informed Consent: I have reviewed the patients History and Physical, chart, labs and discussed the procedure including the risks, benefits and alternatives for the proposed anesthesia with the patient or authorized representative who has indicated his/her understanding and acceptance.  ? ? ? ?Dental advisory given ? ?Plan Discussed with: CRNA and Anesthesiologist ? ?Anesthesia Plan Comments:   ? ? ? ? ? ?Anesthesia Quick Evaluation ? ?

## 2022-03-15 ENCOUNTER — Encounter (HOSPITAL_BASED_OUTPATIENT_CLINIC_OR_DEPARTMENT_OTHER)
Admission: RE | Disposition: A | Discharge status: Home / Self Care | Payer: Self-pay | Source: Home / Self Care | Attending: Obstetrics and Gynecology

## 2022-03-15 ENCOUNTER — Encounter (HOSPITAL_BASED_OUTPATIENT_CLINIC_OR_DEPARTMENT_OTHER): Payer: Self-pay | Admitting: Obstetrics and Gynecology

## 2022-03-15 ENCOUNTER — Ambulatory Visit (HOSPITAL_BASED_OUTPATIENT_CLINIC_OR_DEPARTMENT_OTHER): Payer: 59 | Admitting: Anesthesiology

## 2022-03-15 ENCOUNTER — Ambulatory Visit (HOSPITAL_BASED_OUTPATIENT_CLINIC_OR_DEPARTMENT_OTHER)
Admission: RE | Admit: 2022-03-15 | Discharge: 2022-03-15 | Disposition: A | Discharge status: Home / Self Care | Payer: 59 | Attending: Obstetrics and Gynecology | Admitting: Obstetrics and Gynecology

## 2022-03-15 ENCOUNTER — Other Ambulatory Visit: Payer: Self-pay

## 2022-03-15 DIAGNOSIS — N879 Dysplasia of cervix uteri, unspecified: Secondary | ICD-10-CM | POA: Diagnosis present

## 2022-03-15 DIAGNOSIS — E669 Obesity, unspecified: Secondary | ICD-10-CM | POA: Diagnosis not present

## 2022-03-15 DIAGNOSIS — R87613 High grade squamous intraepithelial lesion on cytologic smear of cervix (HGSIL): Secondary | ICD-10-CM | POA: Diagnosis not present

## 2022-03-15 DIAGNOSIS — G8929 Other chronic pain: Secondary | ICD-10-CM | POA: Diagnosis not present

## 2022-03-15 DIAGNOSIS — Z6829 Body mass index (BMI) 29.0-29.9, adult: Secondary | ICD-10-CM | POA: Insufficient documentation

## 2022-03-15 DIAGNOSIS — F17210 Nicotine dependence, cigarettes, uncomplicated: Secondary | ICD-10-CM | POA: Insufficient documentation

## 2022-03-15 HISTORY — DX: Sciatica, unspecified side: M54.30

## 2022-03-15 HISTORY — DX: Anemia, unspecified: D64.9

## 2022-03-15 HISTORY — DX: High grade squamous intraepithelial lesion on cytologic smear of cervix (HGSIL): R87.613

## 2022-03-15 HISTORY — PX: CERVICAL CONIZATION W/BX: SHX1330

## 2022-03-15 HISTORY — DX: Polyneuropathy, unspecified: G62.9

## 2022-03-15 LAB — CBC
HCT: 38.5 % (ref 36.0–46.0)
Hemoglobin: 12.5 g/dL (ref 12.0–15.0)
MCH: 28.9 pg (ref 26.0–34.0)
MCHC: 32.5 g/dL (ref 30.0–36.0)
MCV: 89.1 fL (ref 80.0–100.0)
Platelets: 331 10*3/uL (ref 150–400)
RBC: 4.32 MIL/uL (ref 3.87–5.11)
RDW: 14.5 % (ref 11.5–15.5)
WBC: 9.6 10*3/uL (ref 4.0–10.5)
nRBC: 0 % (ref 0.0–0.2)

## 2022-03-15 LAB — ABO/RH: ABO/RH(D): O POS

## 2022-03-15 LAB — POCT PREGNANCY, URINE: Preg Test, Ur: NEGATIVE

## 2022-03-15 LAB — TYPE AND SCREEN
ABO/RH(D): O POS
Antibody Screen: NEGATIVE

## 2022-03-15 SURGERY — CONE BIOPSY, CERVIX
Anesthesia: General | Site: Vagina

## 2022-03-15 MED ORDER — ONDANSETRON HCL 4 MG/2ML IJ SOLN
INTRAMUSCULAR | Status: DC | PRN
  Administered 2022-03-15: 4 mg via INTRAVENOUS

## 2022-03-15 MED ORDER — OXYCODONE HCL 5 MG PO TABS: 5.0000 mg | ORAL_TABLET | Freq: Once | ORAL | Status: DC | PRN

## 2022-03-15 MED ORDER — CEFAZOLIN SODIUM-DEXTROSE 2-4 GM/100ML-% IV SOLN
2.0000 g | INTRAVENOUS | Status: AC
  Administered 2022-03-15: 2 g via INTRAVENOUS

## 2022-03-15 MED ORDER — CHLOROPROCAINE HCL 1 % IJ SOLN
INTRAMUSCULAR | Status: AC
  Filled 2022-03-15: qty 30

## 2022-03-15 MED ORDER — PHENYLEPHRINE 80 MCG/ML (10ML) SYRINGE FOR IV PUSH (FOR BLOOD PRESSURE SUPPORT)
PREFILLED_SYRINGE | INTRAVENOUS | Status: AC
  Filled 2022-03-15: qty 10

## 2022-03-15 MED ORDER — PROPOFOL 10 MG/ML IV BOLUS
INTRAVENOUS | Status: AC
  Filled 2022-03-15: qty 20

## 2022-03-15 MED ORDER — FENTANYL CITRATE (PF) 100 MCG/2ML IJ SOLN
INTRAMUSCULAR | Status: DC | PRN
  Administered 2022-03-15: 100 ug via INTRAVENOUS

## 2022-03-15 MED ORDER — LACTATED RINGERS IV SOLN: INTRAVENOUS | Status: DC

## 2022-03-15 MED ORDER — PROPOFOL 10 MG/ML IV BOLUS
INTRAVENOUS | Status: DC | PRN
  Administered 2022-03-15: 200 mg via INTRAVENOUS

## 2022-03-15 MED ORDER — ACETAMINOPHEN 500 MG PO TABS
ORAL_TABLET | ORAL | Status: AC
  Filled 2022-03-15: qty 2

## 2022-03-15 MED ORDER — PHENYLEPHRINE 80 MCG/ML (10ML) SYRINGE FOR IV PUSH (FOR BLOOD PRESSURE SUPPORT)
PREFILLED_SYRINGE | INTRAVENOUS | Status: DC | PRN
  Administered 2022-03-15: 80 ug via INTRAVENOUS

## 2022-03-15 MED ORDER — WHITE PETROLATUM EX OINT
TOPICAL_OINTMENT | CUTANEOUS | Status: AC
  Filled 2022-03-15: qty 5

## 2022-03-15 MED ORDER — MIDAZOLAM HCL 2 MG/2ML IJ SOLN
INTRAMUSCULAR | Status: DC | PRN
Start: 2022-03-15 — End: 2022-03-15
  Administered 2022-03-15: 2 mg via INTRAVENOUS

## 2022-03-15 MED ORDER — MIDAZOLAM HCL 2 MG/2ML IJ SOLN
INTRAMUSCULAR | Status: AC
  Filled 2022-03-15: qty 2

## 2022-03-15 MED ORDER — FENTANYL CITRATE (PF) 100 MCG/2ML IJ SOLN: 25.0000 ug | INTRAMUSCULAR | Status: DC | PRN

## 2022-03-15 MED ORDER — FENTANYL CITRATE (PF) 100 MCG/2ML IJ SOLN
INTRAMUSCULAR | Status: AC
  Filled 2022-03-15: qty 2

## 2022-03-15 MED ORDER — IODINE STRONG (LUGOLS) 5 % PO SOLN
ORAL | Status: AC
  Filled 2022-03-15: qty 1

## 2022-03-15 MED ORDER — DEXAMETHASONE SODIUM PHOSPHATE 10 MG/ML IJ SOLN
INTRAMUSCULAR | Status: AC
  Filled 2022-03-15: qty 1

## 2022-03-15 MED ORDER — DEXAMETHASONE SODIUM PHOSPHATE 10 MG/ML IJ SOLN
INTRAMUSCULAR | Status: DC | PRN
  Administered 2022-03-15 (×2): 5 mg via INTRAVENOUS

## 2022-03-15 MED ORDER — ONDANSETRON HCL 4 MG/2ML IJ SOLN
INTRAMUSCULAR | Status: AC
  Filled 2022-03-15: qty 2

## 2022-03-15 MED ORDER — LIDOCAINE HCL (PF) 2 % IJ SOLN
INTRAMUSCULAR | Status: AC
  Filled 2022-03-15: qty 5

## 2022-03-15 MED ORDER — ACETIC ACID 5 % SOLN
Status: AC
Start: 2022-03-15 — End: ?
  Filled 2022-03-15: qty 50

## 2022-03-15 MED ORDER — SOD CITRATE-CITRIC ACID 500-334 MG/5ML PO SOLN: 30.0000 mL | ORAL | Status: DC

## 2022-03-15 MED ORDER — ONDANSETRON HCL 4 MG/2ML IJ SOLN: 4.0000 mg | Freq: Once | INTRAMUSCULAR | Status: DC | PRN

## 2022-03-15 MED ORDER — LIDOCAINE-EPINEPHRINE 1 %-1:100000 IJ SOLN
INTRAMUSCULAR | Status: DC | PRN
  Administered 2022-03-15: 3 mL

## 2022-03-15 MED ORDER — ACETAMINOPHEN 500 MG PO TABS
1000.0000 mg | ORAL_TABLET | Freq: Once | ORAL | Status: AC
  Administered 2022-03-15: 1000 mg via ORAL

## 2022-03-15 MED ORDER — LIDOCAINE-EPINEPHRINE 1 %-1:100000 IJ SOLN
INTRAMUSCULAR | Status: AC
  Filled 2022-03-15: qty 1

## 2022-03-15 MED ORDER — FERRIC SUBSULFATE 259 MG/GM EX SOLN
CUTANEOUS | Status: AC
  Filled 2022-03-15: qty 8

## 2022-03-15 MED ORDER — MONSELS FERRIC SUBSULFATE EX SOLN
CUTANEOUS | Status: DC | PRN
  Administered 2022-03-15: 1 via TOPICAL

## 2022-03-15 MED ORDER — LIDOCAINE 2% (20 MG/ML) 5 ML SYRINGE
INTRAMUSCULAR | Status: DC | PRN
  Administered 2022-03-15: 60 mg via INTRAVENOUS

## 2022-03-15 MED ORDER — IODINE STRONG (LUGOLS) 5 % PO SOLN
ORAL | Status: DC | PRN
Start: 2022-03-15 — End: 2022-03-15
  Administered 2022-03-15: 0.1 mL

## 2022-03-15 MED ORDER — POVIDONE-IODINE 10 % EX SWAB: 2.0000 "application " | Freq: Once | CUTANEOUS | Status: DC

## 2022-03-15 MED ORDER — CEFAZOLIN SODIUM-DEXTROSE 2-4 GM/100ML-% IV SOLN
INTRAVENOUS | Status: AC
  Filled 2022-03-15: qty 100

## 2022-03-15 MED ORDER — OXYCODONE HCL 5 MG/5ML PO SOLN: 5.0000 mg | Freq: Once | ORAL | Status: DC | PRN

## 2022-03-15 SURGICAL SUPPLY — 23 items
APL SWBSTK 6 STRL LF DISP (MISCELLANEOUS)
APPLICATOR COTTON TIP 6 STRL (MISCELLANEOUS) IMPLANT
APPLICATOR COTTON TIP 6IN STRL (MISCELLANEOUS)
BLADE SURG 11 STRL SS (BLADE) IMPLANT
GAUZE 4X4 16PLY ~~LOC~~+RFID DBL (SPONGE) ×2 IMPLANT
GLOVE BIO SURGEON STRL SZ7.5 (GLOVE) ×2 IMPLANT
GLOVE BIOGEL PI IND STRL 8 (GLOVE) ×1 IMPLANT
GLOVE BIOGEL PI INDICATOR 8 (GLOVE) ×1
GOWN STRL REUS W/TWL LRG LVL3 (GOWN DISPOSABLE) ×4 IMPLANT
HEMOSTAT SURGICEL 2X14 (HEMOSTASIS) IMPLANT
HIBICLENS CHG 4% 4OZ BTL (MISCELLANEOUS) ×2 IMPLANT
KIT TURNOVER CYSTO (KITS) ×2 IMPLANT
NS IRRIG 1000ML POUR BTL (IV SOLUTION) ×2 IMPLANT
PACK VAGINAL WOMENS (CUSTOM PROCEDURE TRAY) ×2 IMPLANT
PAD OB MATERNITY 4.3X12.25 (PERSONAL CARE ITEMS) ×2 IMPLANT
SCOPETTES 8  STERILE (MISCELLANEOUS) ×2
SCOPETTES 8 STERILE (MISCELLANEOUS) ×2 IMPLANT
SPONGE SURGIFOAM ABS GEL 12-7 (HEMOSTASIS) ×1 IMPLANT
SUT CHROMIC 1MO 4 18 CR8 (SUTURE) IMPLANT
SUT VIC AB 0 CT1 27 (SUTURE) ×4
SUT VIC AB 0 CT1 27XBRD ANBCTR (SUTURE) IMPLANT
SYR 20ML LL LF (SYRINGE) IMPLANT
TOWEL OR 17X26 10 PK STRL BLUE (TOWEL DISPOSABLE) ×4 IMPLANT

## 2022-03-15 NOTE — Op Note (Signed)
NAME: Wendy Krause, Wendy M. ?MEDICAL RECORD NO: [id number] ?ACCOUNT NO: [id number] ?DATE OF BIRTH: May 07, 1971 ?FACILITY: [facility] ?LOCATION: [facility] ?PHYSICIAN: Daleen Bo. Lyn Hollingshead, MD ? ?Operative Report  ? ?DATE OF PROCEDURE: 03/15/2022 ? ?PREOPERATIVE DIAGNOSIS:  Cervical dysplasia. ? ?POSTOPERATIVE DIAGNOSIS:  Cervical dysplasia. ? ?PROCEDURE:  Cold knife conization of cervix. ? ?ASSISTANT:  Everlene Farrier II, MD ? ?ANESTHESIA:  General with LMA. ? ?ESTIMATED BLOOD LOSS:  10 mL. ? ?SPECIMENS:  Cervical cone biopsy to pathology. ? ?INDICATIONS AND CONSENT:  This patient is a 51 year old patient with high-grade dysplasia on Pap smear and low-grade biopsies.  Cold knife conization has been discussed.  Potential risks and complications have been discussed preoperatively including but  ?not limited to infection, organ damage, bleeding requiring transfusion of blood products with HIV and hepatitis acquisition, DVT, PE, pneumonia.  The patient states she understands and agrees and consent is signed on the chart. ? ?DESCRIPTION OF PROCEDURE:  The patient was taken to the operating room where she was identified, placed in the dorsal supine position.  She was then placed in the dorsal lithotomy position to assure this does not create any additional stress on her lower ? back.  She then has general anesthesia via LMA.  She was prepped vaginally with Hibiclens and draped in sterile fashion.  Timeout is undertaken.  After being draped, examination reveals the uterus to be anteverted.  Bivalve speculum was placed in the  ?vagina.  0 Vicryl suture was placed at the 3 and 9 o'clock positions of the cervix.  Cervix was then injected with approximately 3 mL total of 1% lidocaine with epinephrine.  Lugol solution was placed to help identify the transformation zone.  Cold knife ? cone was then done without difficulty.  Cautery was used to assure hemostasis.  Gelfoam soaked in Monsel solution was placed over the cone bed and the 3 and 9  o'clock sutures are then tied in the midline to hold this in place.  Excellent hemostasis was  ?noted.  All counts correct.  The patient was awakened, taken to the recovery room in stable condition. ? ? ?SHW ?D: 03/15/2022 8:03:55 am T: 03/15/2022 9:04:00 am  ?JOB: [id number]/ [id number]  ?

## 2022-03-15 NOTE — Transfer of Care (Signed)
Immediate Anesthesia Transfer of Care Note ? ?Patient: Wendy Krause ? ?Procedure(s) Performed: Procedure(s) (LRB): ?CONIZATION CERVIX WITH BIOPSY (N/A) ? ?Patient Location: PACU ? ?Anesthesia Type: General ? ?Level of Consciousness: awake, oriented, sedated and patient cooperative ? ?Airway & Oxygen Therapy: Patient Spontanous Breathing and Patient connected to face mask oxygen ? ?Post-op Assessment: Report given to PACU RN and Post -op Vital signs reviewed and stable ? ?Post vital signs: Reviewed and stable ? ?Complications: No apparent anesthesia complications ? ?Last Vitals:  ?Vitals Value Taken Time  ?BP 105/67 03/15/22 0807  ?Temp    ?Pulse 77 03/15/22 0812  ?Resp 12 03/15/22 0812  ?SpO2 93 % 03/15/22 0812  ?Vitals shown include unvalidated device data. ? ?Last Pain:  ?Vitals:  ? 03/15/22 0608  ?TempSrc: Oral  ?PainSc: 8   ?   ? ?Patients Stated Pain Goal: 5 (03/15/22 7096) ? ?Complications: No notable events documented. ?

## 2022-03-15 NOTE — Discharge Instructions (Signed)
?  Post Anesthesia Home Care Instructions ? ?Activity: ?Get plenty of rest for the remainder of the day. A responsible adult should stay with you for 24 hours following the procedure.  ?For the next 24 hours, DO NOT: ?-Drive a car ?-Paediatric nurse ?-Drink alcoholic beverages ?-Take any medication unless instructed by your physician ?-Make any legal decisions or sign important papers. ? ?Meals: ?Start with liquid foods such as gelatin or soup. Progress to regular foods as tolerated. Avoid greasy, spicy, heavy foods. If nausea and/or vomiting occur, drink only clear liquids until the nausea and/or vomiting subsides. Call your physician if vomiting continues. ? ?Special Instructions/Symptoms: ?Your throat may feel dry or sore from the anesthesia or the breathing tube placed in your throat during surgery. If this causes discomfort, gargle with warm salt water. The discomfort should disappear within 24 hours. ? ?If you had a scopolamine patch placed behind your ear for the management of post- operative nausea and/or vomiting: ? ?1. The medication in the patch is effective for 72 hours, after which it should be removed.  Wrap patch in a tissue and discard in the trash. Wash hands thoroughly with soap and water. ?2. You may remove the patch earlier than 72 hours if you experience unpleasant side effects which may include dry mouth, dizziness or visual disturbances. ?3. Avoid touching the patch. Wash your hands with soap and water after contact with the patch. ?  DISCHARGE INSTRUCTIONS: Conization Cervix with Biopsy ? ?The following instructions have been prepared to help you care for yourself upon your return home. ? ? ?May take stool softner while taking narcotic pain medication to prevent constipation.  Drink plenty of water. ? ?Personal hygiene: ? Use sanitary pads for vaginal drainage, not tampons. ? Shower the day after your procedure. ? NO tub baths, pools or Jacuzzis for 2-3 weeks. ? Wipe front to back after using  the bathroom. ? ?Activity and limitations: ? Do NOT drive or operate any equipment for 24 hours. The effects of anesthesia are still present ?and drowsiness may result. ? Do NOT rest in bed all day. ? Walking is encouraged. ? Walk up and down stairs slowly. ? You may resume your normal activity in one to two days or as indicated by your physician. ?Sexual activity: NO intercourse for at least 2 weeks after the procedure, or as indicated by your ?Doctor. ? ?Diet: Eat a light meal as desired this evening. You may resume your usual diet tomorrow. ? ?Return to Work: You may resume your work activities in one to two days or as indicated by your ?Doctor. ? ?What to expect after your surgery: Expect to have vaginal bleeding/discharge for 2-3 days and ?spotting for up to 10 days. It is not unusual to have soreness for up to 1-2 weeks. You may have a ?slight burning sensation when you urinate for the first day. Mild cramps may continue for a couple of ?days. You may have a regular period in 2-6 weeks. ? ?Call your doctor for any of the following: ? Excessive vaginal bleeding or clotting, saturating and changing one pad every hour. ? Inability to urinate 6 hours after discharge from hospital. ? Pain not relieved by pain medication. ? Fever of 100.4? F or greater. ? Unusual vaginal discharge or odor. ? ? ?

## 2022-03-15 NOTE — Progress Notes (Signed)
03/15/2022 ? ?7:58 AM ? ?PATIENT:  Wendy Krause  51 y.o. female ? ?PRE-OPERATIVE DIAGNOSIS:  DYSPLASIA OF CERVIX ? ?POST-OPERATIVE DIAGNOSIS:  DYSPLASIA OF CERVIX ? ?PROCEDURE:  Procedure(s): ?CONIZATION CERVIX WITH BIOPSY (N/A) ? ?SURGEON:  Surgeon(s) and Role: ?   Everlene Farrier, MD - Primary ? ?PHYSICIAN ASSISTANT:  ? ?ASSISTANTS: none  ? ?ANESTHESIA:   general ? ?EBL:  10 mL  ? ?BLOOD ADMINISTERED:none ? ?DRAINS: none  ? ?LOCAL MEDICATIONS USED:  LIDOCAINE  and Amount: 3 ml ? ?SPECIMEN:  Source of Specimen:  cervical cone biopsy ? ?DISPOSITION OF SPECIMEN:  PATHOLOGY ? ?COUNTS:  YES ? ?TOURNIQUET:  * No tourniquets in log * ? ?DICTATION: .Other Dictation: Dictation Number [id number] ? ?PLAN OF CARE: Discharge to home after PACU ? ?PATIENT DISPOSITION:  PACU - hemodynamically stable. ?  ?Delay start of Pharmacological VTE agent (>24hrs) due to surgical blood loss or risk of bleeding: not applicable ? ?

## 2022-03-15 NOTE — Progress Notes (Signed)
No change to H&P  ?Reviewed procedure- CKC ?All questions answered. She states she understands and agrees ?PO instructions reviewed ?

## 2022-03-15 NOTE — Anesthesia Procedure Notes (Signed)
Procedure Name: LMA Insertion ?Date/Time: 03/15/2022 7:29 AM ?Performed by: Suan Halter, CRNA ?Pre-anesthesia Checklist: Patient identified, Emergency Drugs available, Suction available and Patient being monitored ?Patient Re-evaluated:Patient Re-evaluated prior to induction ?Oxygen Delivery Method: Circle system utilized ?Preoxygenation: Pre-oxygenation with 100% oxygen ?Induction Type: IV induction ?Ventilation: Mask ventilation without difficulty ?LMA: LMA inserted ?LMA Size: 4.0 ?Number of attempts: 1 ?Airway Equipment and Method: Bite block ?Placement Confirmation: positive ETCO2 ?Tube secured with: Tape ?Dental Injury: Teeth and Oropharynx as per pre-operative assessment  ? ? ? ? ?

## 2022-03-15 NOTE — Anesthesia Postprocedure Evaluation (Signed)
Anesthesia Post Note ? ?Patient: Shen[place] Hattabaugh ? ?Procedure(s) Performed: CONIZATION CERVIX WITH BIOPSY (Vagina ) ? ?  ? ?Patient location during evaluation: PACU ?Anesthesia Type: General ?Level of consciousness: awake and alert ?Pain management: pain level controlled (at patient's baseline level) ?Vital Signs Assessment: post-procedure vital signs reviewed and stable ?Respiratory status: spontaneous breathing, nonlabored ventilation and respiratory function stable ?Cardiovascular status: blood pressure returned to baseline and stable ?Postop Assessment: no apparent nausea or vomiting ?Anesthetic complications: no ? ? ?No notable events documented. ? ?Last Vitals:  ?Vitals:  ? 03/15/22 0830 03/15/22 0845  ?BP: 124/78 133/88  ?Pulse: 76 73  ?Resp: 17 12  ?Temp:  36.4 ?C  ?SpO2: 95% 93%  ?  ?Last Pain:  ?Vitals:  ? 03/15/22 0845  ?TempSrc:   ?PainSc: 7   ? ? ?  ?  ?  ?  ?  ?  ? ?Audry Pili ? ? ? ? ?

## 2022-03-18 ENCOUNTER — Encounter (HOSPITAL_BASED_OUTPATIENT_CLINIC_OR_DEPARTMENT_OTHER): Payer: Self-pay | Admitting: Obstetrics and Gynecology

## 2022-03-18 LAB — SURGICAL PATHOLOGY

## 2022-07-25 ENCOUNTER — Encounter (HOSPITAL_BASED_OUTPATIENT_CLINIC_OR_DEPARTMENT_OTHER): Payer: Self-pay | Admitting: Obstetrics and Gynecology

## 2022-07-29 ENCOUNTER — Encounter (HOSPITAL_BASED_OUTPATIENT_CLINIC_OR_DEPARTMENT_OTHER): Payer: Self-pay | Admitting: Obstetrics and Gynecology

## 2022-07-29 ENCOUNTER — Other Ambulatory Visit: Payer: Self-pay

## 2022-07-29 NOTE — Progress Notes (Signed)
Spoke w/ via phone for pre-op interview---[facility] Lab needs dos----urine pregnancy               Lab results------08/12/22 lab appt for cbc, bmp, type & creen COVID test -----patient states asymptomatic no test needed Arrive at -------0530 on Thursday, 08/15/22 NPO after MN NO Solid Food.  Clear liquids from MN until---0430 Med rec completed Medications to take morning of surgery -----Cymbalta, Neurontin, Robaxin Diabetic medication -----n/a Patient instructed no nail polish to be worn day of surgery Patient instructed to bring photo id and insurance card day of surgery Patient aware to have Driver (ride ) / caregiver    for 24 hours after surgery - [facility] K[facility]s Patient Special Instructions -----Extended / overnight stay instructions given. Do your best not to smoke 24 hours before surgery. Pre-Op special Istructions -----none Patient verbalized understanding of instructions that were given at this phone interview. Patient denies shortness of breath, chest pain, fever, cough at this phone interview.

## 2022-07-29 NOTE — Progress Notes (Signed)
Your procedure is scheduled on Thursday, 08/15/22.  Report to [facility].   Call this number if you have problems the morning of surgery  :[phone].   OUR ADDRESS IS [address].  WE ARE LOCATED IN [facility].  PLEASE BRING YOUR INSURANCE CARD AND PHOTO ID DAY OF SURGERY.  ONLY 2 PEOPLE ARE ALLOWED IN  WAITING  ROOM.                                      REMEMBER:  DO NOT EAT FOOD, CANDY GUM OR MINTS  AFTER MIDNIGHT THE NIGHT BEFORE YOUR SURGERY . YOU MAY HAVE CLEAR LIQUIDS FROM MIDNIGHT THE NIGHT BEFORE YOUR SURGERY UNTIL  4:30 AM. NO CLEAR LIQUIDS AFTER   4:30 AM DAY OF SURGERY.  YOU MAY  BRUSH YOUR TEETH MORNING OF SURGERY AND RINSE YOUR MOUTH OUT, NO CHEWING GUM CANDY OR MINTS.     CLEAR LIQUID DIET   Foods Allowed                                                                     Foods Excluded  Coffee and tea, regular and decaf                             liquids that you cannot  Plain Jell-O                                                                   see through such as: Fruit ices (not with fruit pulp)                                     milk, soups, orange juice  Plain  Popsicles                                    All solid food Carbonated beverages, regular and diet                                    Cranberry, grape and apple juices Sports drinks like Gatorade _____________________________________________________________________     TAKE THESE MEDICATIONS MORNING OF SURGERY: Cymbalta, Neurontin, Robaxin if needed    UP TO 4 VISITORS  MAY VISIT IN THE EXTENDED RECOVERY ROOM UNTIL 800 PM ONLY.  ONE  VISITOR AGE 50 AND OVER MAY SPEND THE NIGHT AND MUST BE IN EXTENDED RECOVERY ROOM NO LATER THAN 800 PM . YOUR DISCHARGE TIME AFTER YOU SPEND THE NIGHT IS 900 AM THE MORNING AFTER YOUR SURGERY.  YOU MAY PACK A SMALL OVERNIGHT BAG WITH TOILETRIES FOR YOUR  OVERNIGHT STAY IF YOU WISH.  YOUR PRESCRIPTION  MEDICATIONS WILL BE PROVIDED DURING [facility].                                      DO NOT WEAR JEWERLY, MAKE UP. DO NOT WEAR LOTIONS, POWDERS, PERFUMES OR NAIL POLISH ON YOUR FINGERNAILS. TOENAIL POLISH IS OK TO WEAR. DO NOT SHAVE FOR 48 HOURS PRIOR TO DAY OF SURGERY. MEN MAY SHAVE FACE AND NECK. CONTACTS, GLASSES, OR DENTURES MAY NOT BE WORN TO SURGERY.  REMEMBER: NO SMOKING, DRUGS OR ALCOHOL FOR 24 HOURS BEFORE YOUR SURGERY.                                    [facility] IS NOT RESPONSIBLE  FOR ANY BELONGINGS.                                                                    Marland Kitchen           [facility] - Preparing for Surgery Before surgery, you can play an important role.  Because skin is not sterile, your skin needs to be as free of germs as possible.  You can reduce the number of germs on your skin by washing with CHG (chlorahexidine gluconate) soap before surgery.  CHG is an antiseptic cleaner which kills germs and bonds with the skin to continue killing germs even after washing. Please DO NOT use if you have an allergy to CHG or antibacterial soaps.  If your skin becomes reddened/irritated stop using the CHG and inform your nurse when you arrive at Short Stay. Do not shave (including legs and underarms) for at least 48 hours prior to the first CHG shower.  You may shave your face/neck. Please follow these instructions carefully:  1.  Shower with CHG Soap the night before surgery and the  morning of Surgery.  2.  If you choose to wash your hair, wash your hair first as usual with your  normal  shampoo.  3.  After you shampoo, rinse your hair and body thoroughly to remove the  shampoo.                            4.  Use CHG as you would any other liquid soap.  You can apply chg directly  to the skin and wash , please wash your belly button thoroughly with chg soap provided night before and morning of your surgery.                     Gently with a scrungie or clean washcloth.  5.   Apply the CHG Soap to your body ONLY FROM THE NECK DOWN.   Do not use on face/ open                           Wound or open sores. Avoid contact with eyes, ears mouth and genitals (private parts).  Wash face,  Genitals (private parts) with your normal soap.             6.  Wash thoroughly, paying special attention to the area where your surgery  will be performed.  7.  Thoroughly rinse your body with warm water from the neck down.  8.  DO NOT shower/wash with your normal soap after using and rinsing off  the CHG Soap.                9.  Pat yourself dry with a clean towel.            10.  Wear clean pajamas.            11.  Place clean sheets on your bed the night of your first shower and do not  sleep with pets. Day of Surgery : Do not apply any lotions/deodorants the morning of surgery.  Please wear clean clothes to the hospital/surgery center.  IF YOU HAVE ANY SKIN IRRITATION OR PROBLEMS WITH THE SURGICAL SOAP, PLEASE GET A BAR OF GOLD DIAL SOAP AND SHOWER THE NIGHT BEFORE YOUR SURGERY AND THE MORNING OF YOUR SURGERY. PLEASE LET THE NURSE KNOW MORNING OF YOUR SURGERY IF YOU HAD ANY PROBLEMS WITH THE SURGICAL SOAP.   ________________________________________________________________________                                                        QUESTIONS [facility] PRE OP NURSE PHONE [phone].

## 2022-08-12 ENCOUNTER — Encounter (HOSPITAL_COMMUNITY)
Admission: RE | Admit: 2022-08-12 | Discharge: 2022-08-12 | Disposition: A | Discharge status: Home / Self Care | Payer: 59 | Source: Ambulatory Visit | Attending: Obstetrics and Gynecology | Admitting: Obstetrics and Gynecology

## 2022-08-12 DIAGNOSIS — Z01812 Encounter for preprocedural laboratory examination: Secondary | ICD-10-CM | POA: Insufficient documentation

## 2022-08-12 DIAGNOSIS — R87613 High grade squamous intraepithelial lesion on cytologic smear of cervix (HGSIL): Secondary | ICD-10-CM

## 2022-08-12 DIAGNOSIS — Z01818 Encounter for other preprocedural examination: Secondary | ICD-10-CM

## 2022-08-12 LAB — CBC
HCT: 44.3 % (ref 36.0–46.0)
Hemoglobin: 14.6 g/dL (ref 12.0–15.0)
MCH: 30.4 pg (ref 26.0–34.0)
MCHC: 33 g/dL (ref 30.0–36.0)
MCV: 92.1 fL (ref 80.0–100.0)
Platelets: 272 10*3/uL (ref 150–400)
RBC: 4.81 MIL/uL (ref 3.87–5.11)
RDW: 12.8 % (ref 11.5–15.5)
WBC: 9.8 10*3/uL (ref 4.0–10.5)
nRBC: 0 % (ref 0.0–0.2)

## 2022-08-12 LAB — BASIC METABOLIC PANEL
Anion gap: 7 (ref 5–15)
BUN: 11 mg/dL (ref 6–20)
CO2: 25 mmol/L (ref 22–32)
Calcium: 9.7 mg/dL (ref 8.9–10.3)
Chloride: 105 mmol/L (ref 98–111)
Creatinine, Ser: 0.7 mg/dL (ref 0.44–1.00)
GFR, Estimated: >60 mL/min (ref >60)
Glucose, Bld: 87 mg/dL (ref 70–99)
Potassium: 4.3 mmol/L (ref 3.5–5.1)
Sodium: 137 mmol/L (ref 135–145)

## 2022-08-12 NOTE — Progress Notes (Signed)
Good Morning , Dr Gaetano Net This pt had lab work done today, results in epic.  Labs are normal but bp was high 141/ 103. Pt stated she was having bad back pain today. Also, stated at your office her bp was 148/ 90.  In pt's history she does have diagnosis hypertension and does not take medication for it. Just wanted you to be aware.  Thank you.

## 2022-08-14 NOTE — H&P (Signed)
Wendy Krause is an 51 y.o. female G5P4 with severe cervical dysplasia. Wendy Krause was referred as a new patient in February of this year for an abnormal pap. She reports an "abnormal colposcopy" about 10 years ago but did not follow up due to family matters. She then had an abnormal pap with positive HR HPV. 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 Cervical biopsies were low grade. A CKC done on 03/15/22 was CIN 3 extending to endocervical glands and 3/4 ectocervical margins. Telephone consultation with Dr Berline Lopes, Gyn Onc, recommended either repeat CKC or hysterectomy. The patient opts for hysterectomy. She has a long history of multiple back surgeries and back pain.  Pertinent Gynecological History: Menses: flow is moderate Bleeding:  Contraception: tubal ligation DES exposure: denies Blood transfusions: none Sexually transmitted diseases: no past history Previous GYN Procedures:  CKC   Last mammogram: normal Date: 1/23 Last pap: abnormal: CIN 3  Date: 2023 OB History: G5, P4   Menstrual History: Menarche age: unknown Patient's last menstrual period was 04/18/2022 (approximate).    Past Medical History:  Diagnosis Date   Anemia    borderline per pt   Arthritis    knees, back, hands   Cancer ([facility]) 2023   cervical   Carpal tunnel syndrome    bilateral, had bilateral carpal tunnel surgery in 2022   Depression    Follows with PCP, Dr. Reece Levy @ [facility] in [place].   Failed back syndrome of lumbar spine    Pt follows with Lonell Face, NP at [facility], [place] 12/25/21. Pt has had lumbar epidural steroid injetions, multiple weeks of physcial therapy, and been treated w/ Cymbalta, gabapentin,and a low-dose oxycodone.   Headache    hx of migraines   Hernia of abdominal wall    Patient states that she has a couple of hernias in her abdomen, but they have never been diagnosed.   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/2022    Neuropathy    left knee to foot   Pneumonia    x 2 many years ago when she lived up [place], about 18 years ago   Sciatica    bilateral   Uses walker 2023   Wears glasses     Past Surgical History:  Procedure Laterality Date   BILATERAL CARPAL TUNNEL RELEASE  2022   w/ trigger thumb release   CERVICAL CONIZATION W/BX N/A 03/15/2022   Procedure: CONIZATION CERVIX WITH BIOPSY;  Surgeon: Everlene Farrier, MD;  Location: [facility];  Service: Gynecology;  Laterality: N/A;   CESAREAN SECTION     2008   CESAREAN SECTION     2012   KNEE SURGERY Left    51 years old   LAMINECTOMY  2020   L5-S1   LUMBAR FUSION  01/2020   LUMBAR FUSION  03/01/2020   surgery redone per patient   TONSILLECTOMY     when pt was 51 years old    History reviewed. No pertinent family history.  Social History:  reports that she has been smoking cigarettes. She has a 15.00 pack-year smoking history. She has never used smokeless tobacco. She reports that she does not currently use alcohol. She reports current drug use. Drug: Other-see comments.  Allergies:  Allergies  Allergen Reactions   Morphine     Other reaction(s): Agitation Combative   Sulfa Antibiotics     Other reaction(s): Other blisters    No medications prior to  admission.    Review of Systems  Constitutional:  Negative for fever.  Musculoskeletal:  Positive for back pain.    Height '5\' 4"'$  (1.626 m), weight 84.8 kg, last menstrual period 04/18/2022. Physical Exam Cardiovascular:     Rate and Rhythm: Normal rate.  Pulmonary:     Effort: Pulmonary effort is normal.     No results found for this or any previous visit (from the past 24 hour(s)).  No results found.  Assessment/Plan: 51 yo G5P4 with CIN 3 on CKC extending to endocervical and ectocervical margins LAVH/BSO D/W patient and risks including infection, organ damage, bleeding/transfusion-HIV/Hep, DVT/PE, pneumonia, laparotomy.   Shon Millet  II 08/14/2022, 6:03 PM

## 2022-08-15 ENCOUNTER — Observation Stay (HOSPITAL_BASED_OUTPATIENT_CLINIC_OR_DEPARTMENT_OTHER): Payer: 59 | Admitting: Anesthesiology

## 2022-08-15 ENCOUNTER — Other Ambulatory Visit: Payer: Self-pay

## 2022-08-15 ENCOUNTER — Encounter (HOSPITAL_BASED_OUTPATIENT_CLINIC_OR_DEPARTMENT_OTHER)
Admission: RE | Disposition: A | Discharge status: Home / Self Care | Payer: Self-pay | Source: Ambulatory Visit | Attending: Obstetrics and Gynecology

## 2022-08-15 ENCOUNTER — Observation Stay (HOSPITAL_BASED_OUTPATIENT_CLINIC_OR_DEPARTMENT_OTHER)
Admission: RE | Admit: 2022-08-15 | Discharge: 2022-08-15 | Disposition: A | Discharge status: Home / Self Care | Payer: 59 | Source: Ambulatory Visit | Attending: Obstetrics and Gynecology | Admitting: Obstetrics and Gynecology

## 2022-08-15 ENCOUNTER — Encounter (HOSPITAL_BASED_OUTPATIENT_CLINIC_OR_DEPARTMENT_OTHER): Payer: Self-pay | Admitting: Obstetrics and Gynecology

## 2022-08-15 DIAGNOSIS — N8003 Adenomyosis of the uterus: Secondary | ICD-10-CM | POA: Insufficient documentation

## 2022-08-15 DIAGNOSIS — Z8541 Personal history of malignant neoplasm of cervix uteri: Secondary | ICD-10-CM | POA: Diagnosis not present

## 2022-08-15 DIAGNOSIS — D069 Carcinoma in situ of cervix, unspecified: Secondary | ICD-10-CM | POA: Diagnosis not present

## 2022-08-15 DIAGNOSIS — Z01818 Encounter for other preprocedural examination: Secondary | ICD-10-CM

## 2022-08-15 DIAGNOSIS — D259 Leiomyoma of uterus, unspecified: Principal | ICD-10-CM | POA: Insufficient documentation

## 2022-08-15 DIAGNOSIS — R87613 High grade squamous intraepithelial lesion on cytologic smear of cervix (HGSIL): Secondary | ICD-10-CM | POA: Diagnosis present

## 2022-08-15 DIAGNOSIS — N871 Moderate cervical dysplasia: Secondary | ICD-10-CM | POA: Diagnosis present

## 2022-08-15 DIAGNOSIS — F17210 Nicotine dependence, cigarettes, uncomplicated: Secondary | ICD-10-CM | POA: Diagnosis not present

## 2022-08-15 HISTORY — DX: Carpal tunnel syndrome, unspecified upper limb: G56.00

## 2022-08-15 HISTORY — PX: LAPAROSCOPIC VAGINAL HYSTERECTOMY WITH SALPINGO OOPHORECTOMY: SHX6681

## 2022-08-15 HISTORY — DX: Presence of spectacles and contact lenses: Z97.3

## 2022-08-15 HISTORY — DX: Pneumonia, unspecified organism: J18.9

## 2022-08-15 HISTORY — DX: Unspecified osteoarthritis, unspecified site: M19.90

## 2022-08-15 HISTORY — DX: Headache, unspecified: R51.9

## 2022-08-15 HISTORY — DX: Ventral hernia without obstruction or gangrene: K43.9

## 2022-08-15 HISTORY — DX: Depression, unspecified: F32.A

## 2022-08-15 HISTORY — DX: Postlaminectomy syndrome, not elsewhere classified: M96.1

## 2022-08-15 LAB — TYPE AND SCREEN
ABO/RH(D): O POS
Antibody Screen: NEGATIVE

## 2022-08-15 LAB — POCT PREGNANCY, URINE: Preg Test, Ur: NEGATIVE

## 2022-08-15 SURGERY — HYSTERECTOMY, VAGINAL, LAPAROSCOPY-ASSISTED, WITH SALPINGO-OOPHORECTOMY
Anesthesia: General | Site: Abdomen | Laterality: Bilateral

## 2022-08-15 MED ORDER — ACETAMINOPHEN 500 MG PO TABS
ORAL_TABLET | ORAL | Status: AC
  Filled 2022-08-15: qty 2

## 2022-08-15 MED ORDER — KETAMINE HCL 50 MG/5ML IJ SOSY
PREFILLED_SYRINGE | INTRAMUSCULAR | Status: AC
  Filled 2022-08-15: qty 5

## 2022-08-15 MED ORDER — OXYCODONE HCL 5 MG PO TABS
ORAL_TABLET | ORAL | Status: AC
  Filled 2022-08-15: qty 2

## 2022-08-15 MED ORDER — PHENYLEPHRINE 80 MCG/ML (10ML) SYRINGE FOR IV PUSH (FOR BLOOD PRESSURE SUPPORT)
PREFILLED_SYRINGE | INTRAVENOUS | Status: AC
  Filled 2022-08-15: qty 10

## 2022-08-15 MED ORDER — MIDAZOLAM HCL 5 MG/5ML IJ SOLN
INTRAMUSCULAR | Status: DC | PRN
  Administered 2022-08-15: 2 mg via INTRAVENOUS

## 2022-08-15 MED ORDER — HYDROMORPHONE HCL 1 MG/ML IJ SOLN
INTRAMUSCULAR | Status: DC | PRN
  Administered 2022-08-15: 1 mg via INTRAVENOUS

## 2022-08-15 MED ORDER — GABAPENTIN 300 MG PO CAPS
ORAL_CAPSULE | ORAL | Status: AC
  Filled 2022-08-15: qty 2

## 2022-08-15 MED ORDER — BUPIVACAINE HCL (PF) 0.5 % IJ SOLN
INTRAMUSCULAR | Status: DC | PRN
  Administered 2022-08-15: 30 mL

## 2022-08-15 MED ORDER — HYDROMORPHONE HCL 2 MG/ML IJ SOLN
INTRAMUSCULAR | Status: AC
  Filled 2022-08-15: qty 1

## 2022-08-15 MED ORDER — CEFAZOLIN SODIUM-DEXTROSE 2-4 GM/100ML-% IV SOLN
2.0000 g | INTRAVENOUS | Status: AC
  Administered 2022-08-15: 2 g via INTRAVENOUS

## 2022-08-15 MED ORDER — FENTANYL CITRATE (PF) 100 MCG/2ML IJ SOLN
INTRAMUSCULAR | Status: DC | PRN
  Administered 2022-08-15: 100 ug via INTRAVENOUS
  Administered 2022-08-15: 50 ug via INTRAVENOUS

## 2022-08-15 MED ORDER — OXYCODONE HCL 5 MG/5ML PO SOLN: 5.0000 mg | Freq: Once | ORAL | Status: DC | PRN

## 2022-08-15 MED ORDER — MIDAZOLAM HCL 2 MG/2ML IJ SOLN
INTRAMUSCULAR | Status: AC
  Filled 2022-08-15: qty 2

## 2022-08-15 MED ORDER — METHOCARBAMOL 500 MG PO TABS
750.0000 mg | ORAL_TABLET | Freq: Four times a day (QID) | ORAL | Status: DC
  Administered 2022-08-15 (×2): 750 mg via ORAL

## 2022-08-15 MED ORDER — SOD CITRATE-CITRIC ACID 500-334 MG/5ML PO SOLN: 30.0000 mL | ORAL | Status: DC

## 2022-08-15 MED ORDER — ARTIFICIAL TEARS OPHTHALMIC OINT
TOPICAL_OINTMENT | OPHTHALMIC | Status: AC
  Filled 2022-08-15: qty 3.5

## 2022-08-15 MED ORDER — CEFAZOLIN SODIUM-DEXTROSE 2-4 GM/100ML-% IV SOLN
INTRAVENOUS | Status: AC
  Filled 2022-08-15: qty 100

## 2022-08-15 MED ORDER — PROPOFOL 10 MG/ML IV BOLUS
INTRAVENOUS | Status: DC | PRN
  Administered 2022-08-15: 160 mg via INTRAVENOUS

## 2022-08-15 MED ORDER — GABAPENTIN 300 MG PO CAPS
800.0000 mg | ORAL_CAPSULE | Freq: Four times a day (QID) | ORAL | Status: DC | PRN
  Administered 2022-08-15 (×2): 800 mg via ORAL

## 2022-08-15 MED ORDER — DEXAMETHASONE SODIUM PHOSPHATE 10 MG/ML IJ SOLN
INTRAMUSCULAR | Status: AC
  Filled 2022-08-15: qty 1

## 2022-08-15 MED ORDER — METHOCARBAMOL 500 MG PO TABS
ORAL_TABLET | ORAL | Status: AC
  Filled 2022-08-15: qty 2

## 2022-08-15 MED ORDER — LIDOCAINE HCL 2 % IJ SOLN
INTRAMUSCULAR | Status: AC
  Filled 2022-08-15: qty 10

## 2022-08-15 MED ORDER — ROCURONIUM BROMIDE 10 MG/ML (PF) SYRINGE
PREFILLED_SYRINGE | INTRAVENOUS | Status: DC | PRN
  Administered 2022-08-15: 80 mg via INTRAVENOUS

## 2022-08-15 MED ORDER — KETAMINE HCL 10 MG/ML IJ SOLN
INTRAMUSCULAR | Status: DC | PRN
  Administered 2022-08-15: 30 mg via INTRAVENOUS

## 2022-08-15 MED ORDER — GABAPENTIN 100 MG PO CAPS
ORAL_CAPSULE | ORAL | Status: AC
  Filled 2022-08-15: qty 2

## 2022-08-15 MED ORDER — HYDROMORPHONE HCL 1 MG/ML IJ SOLN: 0.2000 mg | INTRAMUSCULAR | Status: DC | PRN

## 2022-08-15 MED ORDER — ONDANSETRON HCL 4 MG PO TABS: 4.0000 mg | ORAL_TABLET | Freq: Four times a day (QID) | ORAL | Status: DC | PRN

## 2022-08-15 MED ORDER — HYDROMORPHONE HCL 1 MG/ML IJ SOLN
0.2500 mg | INTRAMUSCULAR | Status: DC | PRN
  Administered 2022-08-15: 0.25 mg via INTRAVENOUS

## 2022-08-15 MED ORDER — PHENYLEPHRINE 80 MCG/ML (10ML) SYRINGE FOR IV PUSH (FOR BLOOD PRESSURE SUPPORT)
PREFILLED_SYRINGE | INTRAVENOUS | Status: DC | PRN
  Administered 2022-08-15: 80 ug via INTRAVENOUS
  Administered 2022-08-15: 160 ug via INTRAVENOUS
  Administered 2022-08-15: 80 ug via INTRAVENOUS
  Administered 2022-08-15 (×4): 160 ug via INTRAVENOUS

## 2022-08-15 MED ORDER — KETOROLAC TROMETHAMINE 30 MG/ML IJ SOLN
INTRAMUSCULAR | Status: AC
  Filled 2022-08-15: qty 1

## 2022-08-15 MED ORDER — LACTATED RINGERS IV SOLN: INTRAVENOUS | Status: DC

## 2022-08-15 MED ORDER — ACETAMINOPHEN 500 MG PO TABS
1000.0000 mg | ORAL_TABLET | Freq: Four times a day (QID) | ORAL | Status: DC
  Administered 2022-08-15 (×2): 1000 mg via ORAL

## 2022-08-15 MED ORDER — ONDANSETRON HCL 4 MG/2ML IJ SOLN: 4.0000 mg | Freq: Four times a day (QID) | INTRAMUSCULAR | Status: DC | PRN

## 2022-08-15 MED ORDER — AMISULPRIDE (ANTIEMETIC) 5 MG/2ML IV SOLN: 10.0000 mg | Freq: Once | INTRAVENOUS | Status: DC | PRN

## 2022-08-15 MED ORDER — LIDOCAINE HCL (PF) 2 % IJ SOLN
INTRAMUSCULAR | Status: DC | PRN
  Administered 2022-08-15: 1.5 mg/kg/h via INTRADERMAL

## 2022-08-15 MED ORDER — LIDOCAINE 2% (20 MG/ML) 5 ML SYRINGE
INTRAMUSCULAR | Status: DC | PRN
  Administered 2022-08-15: 80 mg via INTRAVENOUS

## 2022-08-15 MED ORDER — KETOROLAC TROMETHAMINE 30 MG/ML IJ SOLN
INTRAMUSCULAR | Status: DC | PRN
  Administered 2022-08-15: 30 mg via INTRAVENOUS

## 2022-08-15 MED ORDER — OXYCODONE HCL 5 MG PO TABS: 5.0000 mg | ORAL_TABLET | Freq: Four times a day (QID) | ORAL | 0 refills | Status: AC | PRN

## 2022-08-15 MED ORDER — SODIUM CHLORIDE 0.9 % IR SOLN
Status: DC | PRN
  Administered 2022-08-15: 1000 mL

## 2022-08-15 MED ORDER — GABAPENTIN 800 MG PO TABS: 800.0000 mg | ORAL_TABLET | Freq: Four times a day (QID) | ORAL | Status: DC | PRN

## 2022-08-15 MED ORDER — HYDROMORPHONE HCL 1 MG/ML IJ SOLN
INTRAMUSCULAR | Status: AC
  Filled 2022-08-15: qty 1

## 2022-08-15 MED ORDER — ONDANSETRON HCL 4 MG/2ML IJ SOLN
INTRAMUSCULAR | Status: DC | PRN
  Administered 2022-08-15: 4 mg via INTRAVENOUS

## 2022-08-15 MED ORDER — LIDOCAINE HCL (PF) 2 % IJ SOLN
INTRAMUSCULAR | Status: AC
  Filled 2022-08-15: qty 5

## 2022-08-15 MED ORDER — MAGNESIUM HYDROXIDE 400 MG/5ML PO SUSP: 30.0000 mL | Freq: Every day | ORAL | Status: DC | PRN

## 2022-08-15 MED ORDER — ROCURONIUM BROMIDE 10 MG/ML (PF) SYRINGE
PREFILLED_SYRINGE | INTRAVENOUS | Status: AC
  Filled 2022-08-15: qty 10

## 2022-08-15 MED ORDER — PROPOFOL 10 MG/ML IV BOLUS
INTRAVENOUS | Status: AC
  Filled 2022-08-15: qty 20

## 2022-08-15 MED ORDER — 0.9 % SODIUM CHLORIDE (POUR BTL) OPTIME
TOPICAL | Status: DC | PRN
  Administered 2022-08-15: 500 mL

## 2022-08-15 MED ORDER — FENTANYL CITRATE (PF) 250 MCG/5ML IJ SOLN
INTRAMUSCULAR | Status: AC
  Filled 2022-08-15: qty 5

## 2022-08-15 MED ORDER — DEXAMETHASONE SODIUM PHOSPHATE 10 MG/ML IJ SOLN
INTRAMUSCULAR | Status: DC | PRN
  Administered 2022-08-15: 10 mg via INTRAVENOUS

## 2022-08-15 MED ORDER — HEMOSTATIC AGENTS (NO CHARGE) OPTIME
TOPICAL | Status: DC | PRN
  Administered 2022-08-15: 1 via TOPICAL

## 2022-08-15 MED ORDER — PROMETHAZINE HCL 25 MG/ML IJ SOLN: 6.2500 mg | INTRAMUSCULAR | Status: DC | PRN

## 2022-08-15 MED ORDER — POVIDONE-IODINE 10 % EX SWAB: 2.0000 | Freq: Once | CUTANEOUS | Status: DC

## 2022-08-15 MED ORDER — OXYCODONE HCL 5 MG PO TABS
5.0000 mg | ORAL_TABLET | ORAL | Status: DC | PRN
  Administered 2022-08-15 (×3): 10 mg via ORAL

## 2022-08-15 MED ORDER — MEPERIDINE HCL 25 MG/ML IJ SOLN: 6.2500 mg | INTRAMUSCULAR | Status: DC | PRN

## 2022-08-15 MED ORDER — SUGAMMADEX SODIUM 200 MG/2ML IV SOLN
INTRAVENOUS | Status: DC | PRN
  Administered 2022-08-15 (×2): 100 mg via INTRAVENOUS

## 2022-08-15 MED ORDER — SIMETHICONE 80 MG PO CHEW: 80.0000 mg | CHEWABLE_TABLET | Freq: Four times a day (QID) | ORAL | Status: DC | PRN

## 2022-08-15 MED ORDER — OXYCODONE HCL 5 MG PO TABS: 5.0000 mg | ORAL_TABLET | Freq: Once | ORAL | Status: DC | PRN

## 2022-08-15 MED ORDER — MIRTAZAPINE 7.5 MG PO TABS: 7.5000 mg | ORAL_TABLET | Freq: Every day | ORAL | Status: DC

## 2022-08-15 MED ORDER — ONDANSETRON HCL 4 MG/2ML IJ SOLN
INTRAMUSCULAR | Status: AC
  Filled 2022-08-15: qty 2

## 2022-08-15 SURGICAL SUPPLY — 55 items
ADH SKN CLS APL DERMABOND .7 (GAUZE/BANDAGES/DRESSINGS) ×1
APL SRG 38 LTWT LNG FL B (MISCELLANEOUS) ×1
APPLICATOR ARISTA FLEXITIP XL (MISCELLANEOUS) IMPLANT
BLADE CLIPPER SENSICLIP SURGIC (BLADE) IMPLANT
CATH ROBINSON RED A/P 16FR (CATHETERS) ×1 IMPLANT
COVER BACK TABLE 60X90IN (DRAPES) ×1 IMPLANT
COVER MAYO STAND STRL (DRAPES) ×2 IMPLANT
DERMABOND ADVANCED .7 DNX12 (GAUZE/BANDAGES/DRESSINGS) ×1 IMPLANT
DRSG OPSITE POSTOP 3X4 (GAUZE/BANDAGES/DRESSINGS) ×1 IMPLANT
DURAPREP 26ML APPLICATOR (WOUND CARE) ×1 IMPLANT
ELECT REM PT RETURN 9FT ADLT (ELECTROSURGICAL) ×1
ELECTRODE REM PT RTRN 9FT ADLT (ELECTROSURGICAL) ×1 IMPLANT
GAUZE 4X4 16PLY ~~LOC~~+RFID DBL (SPONGE) ×3 IMPLANT
GLOVE BIO SURGEON STRL SZ8 (GLOVE) ×3 IMPLANT
GLOVE ECLIPSE 6.5 STRL STRAW (GLOVE) ×1 IMPLANT
GOWN STRL REUS W/TWL XL LVL3 (GOWN DISPOSABLE) ×2 IMPLANT
HEMOSTAT ARISTA ABSORB 3G PWDR (HEMOSTASIS) IMPLANT
HOLDER FOLEY CATH W/STRAP (MISCELLANEOUS) ×1 IMPLANT
KIT TURNOVER CYSTO (KITS) ×1 IMPLANT
LIGASURE IMPACT 36 18CM CVD LR (INSTRUMENTS) IMPLANT
NDL INSUFFLATION 14GA 120MM (NEEDLE) ×1 IMPLANT
NDL INSUFFLATION 14GA 150MM (NEEDLE) IMPLANT
NEEDLE INSUFFLATION 14GA 120MM (NEEDLE) ×1 IMPLANT
NEEDLE INSUFFLATION 14GA 150MM (NEEDLE) IMPLANT
NS IRRIG 500ML POUR BTL (IV SOLUTION) ×1 IMPLANT
PACK LAVH (CUSTOM PROCEDURE TRAY) ×1 IMPLANT
PACK TRENDGUARD 450 HYBRID PRO (MISCELLANEOUS) IMPLANT
PAD OB MATERNITY 4.3X12.25 (PERSONAL CARE ITEMS) ×1 IMPLANT
PAD PREP 24X48 CUFFED NSTRL (MISCELLANEOUS) ×1 IMPLANT
SCISSORS LAP 5X35 DISP (ENDOMECHANICALS) IMPLANT
SCISSORS LAP 5X45 EPIX DISP (ENDOMECHANICALS) IMPLANT
SEALER TISSUE G2 CVD JAW 45CM (ENDOMECHANICALS) ×1 IMPLANT
SET SUCTION IRRIG HYDROSURG (IRRIGATION / IRRIGATOR) IMPLANT
SET TUBE SMOKE EVAC HIGH FLOW (TUBING) ×1 IMPLANT
SLEEVE SURGEON STRL (DRAPES) IMPLANT
SOLUTION ELECTROLUBE (MISCELLANEOUS) IMPLANT
SPIKE FLUID TRANSFER (MISCELLANEOUS) IMPLANT
SPONGE T-LAP 4X18 ~~LOC~~+RFID (SPONGE) ×1 IMPLANT
SUT MNCRL 0 1X36 CT-1 (SUTURE) IMPLANT
SUT MNCRL 0 MO-4 VIOLET 18 CR (SUTURE) ×2 IMPLANT
SUT MNCRL 0 VIOLET 6X18 (SUTURE) IMPLANT
SUT MON AB-0 CT1 36 (SUTURE) IMPLANT
SUT MONOCRYL 0 (SUTURE)
SUT MONOCRYL 0 6X18 (SUTURE)
SUT MONOCRYL 0 MO 4 18  CR/8 (SUTURE) ×2
SUT VIC AB 4-0 PS2 18 (SUTURE) ×1 IMPLANT
SUT VICRYL 0 UR6 27IN ABS (SUTURE) ×1 IMPLANT
SYR 30ML LL (SYRINGE) ×1 IMPLANT
SYR BULB IRRIG 60ML STRL (SYRINGE) IMPLANT
TOWEL OR 17X26 10 PK STRL BLUE (TOWEL DISPOSABLE) ×1 IMPLANT
TRAY FOLEY W/BAG SLVR 14FR LF (SET/KITS/TRAYS/PACK) ×1 IMPLANT
TRENDGUARD 450 HYBRID PRO PACK (MISCELLANEOUS) ×1
TROCAR Z-THREAD FIOS 11X100 BL (TROCAR) ×1 IMPLANT
TROCAR Z-THREAD FIOS 5X100MM (TROCAR) ×1 IMPLANT
WARMER LAPAROSCOPE (MISCELLANEOUS) ×1 IMPLANT

## 2022-08-15 NOTE — Transfer of Care (Signed)
Immediate Anesthesia Transfer of Care Note  Patient: Wendy Krause  Procedure(s) Performed: LAPAROSCOPIC ASSISTED VAGINAL HYSTERECTOMY WITH BILATERAL SALPINGO OOPHORECTOMY (Bilateral: Abdomen)  Patient Location: PACU  Anesthesia Type:General  Level of Consciousness: awake, alert , oriented and patient cooperative  Airway & Oxygen Therapy: Patient Spontanous Breathing and Patient connected to face mask oxygen  Post-op Assessment: Report given to RN and Post -op Vital signs reviewed and stable  Post vital signs: Reviewed and stable  Last Vitals:  Vitals Value Taken Time  BP 159/95 08/15/22 0920  Temp    Pulse 98 08/15/22 0926  Resp 22 08/15/22 0926  SpO2 88 % 08/15/22 0926  Vitals shown include unvalidated device data.  Last Pain:  Vitals:   08/15/22 0558  TempSrc: Oral  PainSc: 7       Patients Stated Pain Goal: 3 (84/66/59 9357)  Complications: No notable events documented.

## 2022-08-15 NOTE — Progress Notes (Signed)
Ambulating well, tolerating regular diet, good pain relief  Today's Vitals   08/15/22 1119 08/15/22 1230 08/15/22 1604 08/15/22 1616  BP: (!) 129/93 130/80 104/63   Pulse: 88 91 78   Resp: '18 16 18   '$ Temp:   97.6 F (36.4 C)   TempSrc:   Oral   SpO2: 95% 98% 94%   Weight:      Height:      PainSc:    6    Body mass index is 30.21 kg/m.   Abdomen soft  UO clear-foley in place  A/P: D/W patient options. She wants to go home tonight D/C instructions reviewed Oxycodone '5mg'$  #28 Tylenol/ibuprofen reviewed, continue gabapentin and methocarbamal FU office 1-2 weeks Will remove foley and must void before discharge

## 2022-08-15 NOTE — Anesthesia Preprocedure Evaluation (Signed)
Anesthesia Evaluation  Patient identified by MRN, date of birth, ID band Patient awake    Reviewed: Allergy & Precautions, NPO status , Patient's Chart, lab work & pertinent test results  History of Anesthesia Complications Negative for: history of anesthetic complications  Airway Mallampati: II  TM Distance: >3 FB Neck ROM: Full    Dental no notable dental hx. (+) Dental Advisory Given   Pulmonary Current SmokerPatient did not abstain from smoking.,    Pulmonary exam normal breath sounds clear to auscultation       Cardiovascular negative cardio ROS Normal cardiovascular exam Rhythm:Regular Rate:Normal     Neuro/Psych  Headaches, Depression  Neuromuscular disease negative psych ROS   GI/Hepatic negative GI ROS, Neg liver ROS,   Endo/Other   Obesity   Renal/GU negative Renal ROS     Musculoskeletal  (+) Arthritis , Osteoarthritis,    Abdominal (+) + obese,   Peds  Hematology negative hematology ROS (+)   Anesthesia Other Findings Chronic pain   Reproductive/Obstetrics                             Anesthesia Physical  Anesthesia Plan  ASA: 2  Anesthesia Plan: General   Post-op Pain Management: Dilaudid IV, Ketamine IV*, Lidocaine infusion*, Ofirmev IV (intra-op)* and Toradol IV (intra-op)*   Induction: Intravenous  PONV Risk Score and Plan: 3 and Treatment may vary due to age or medical condition, Ondansetron, Dexamethasone and Midazolam  Airway Management Planned: Oral ETT  Additional Equipment: None  Intra-op Plan:   Post-operative Plan: Extubation in OR  Informed Consent: I have reviewed the patients History and Physical, chart, labs and discussed the procedure including the risks, benefits and alternatives for the proposed anesthesia with the patient or authorized representative who has indicated his/her understanding and acceptance.     Dental advisory given  Plan  Discussed with: CRNA and Anesthesiologist  Anesthesia Plan Comments:         Anesthesia Quick Evaluation

## 2022-08-15 NOTE — Anesthesia Postprocedure Evaluation (Signed)
Anesthesia Post Note  Patient: Wendy Krause  Procedure(s) Performed: LAPAROSCOPIC ASSISTED VAGINAL HYSTERECTOMY WITH BILATERAL SALPINGO OOPHORECTOMY (Bilateral: Abdomen)     Patient location during evaluation: PACU Anesthesia Type: General Level of consciousness: awake and alert Pain management: pain level controlled Vital Signs Assessment: post-procedure vital signs reviewed and stable Respiratory status: spontaneous breathing, nonlabored ventilation and respiratory function stable Cardiovascular status: blood pressure returned to baseline and stable Postop Assessment: no apparent nausea or vomiting Anesthetic complications: no   No notable events documented.  Last Vitals:  Vitals:   08/15/22 1015 08/15/22 1021  BP: 115/70   Pulse: 89   Resp: 13   Temp: 36.5 C   SpO2: 94% 99%    Last Pain:  Vitals:   08/15/22 1000  TempSrc:   PainSc: 2                  Lynda Rainwater

## 2022-08-15 NOTE — Anesthesia Procedure Notes (Signed)
Procedure Name: Intubation Date/Time: 08/15/2022 7:35 AM  Performed by: Rogers Blocker, CRNAPre-anesthesia Checklist: Patient identified, Emergency Drugs available, Suction available and Patient being monitored Patient Re-evaluated:Patient Re-evaluated prior to induction Oxygen Delivery Method: Circle System Utilized Preoxygenation: Pre-oxygenation with 100% oxygen Induction Type: IV induction Ventilation: Mask ventilation without difficulty Laryngoscope Size: Mac and 3 Grade View: Grade I Tube type: Oral Tube size: 7.0 mm Number of attempts: 1 Airway Equipment and Method: Stylet and Bite block Placement Confirmation: ETT inserted through vocal cords under direct vision, positive ETCO2 and breath sounds checked- equal and bilateral Secured at: 22 cm Tube secured with: Tape Dental Injury: Teeth and Oropharynx as per pre-operative assessment

## 2022-08-15 NOTE — Discharge Summary (Signed)
Physician Discharge Summary  Patient ID: Wendy Krause MRN: [id number] DOB/AGE: 1971/11/02 51 y.o.  Admit date: 08/15/2022 Discharge date: 08/15/2022  Admission Diagnoses:Principal Problem:   High grade squamous intraepithelial cervical dysplasia Active Problems:   Severe cervical dysplasia  Discharge Diagnoses:  Principal Problem:   High grade squamous intraepithelial cervical dysplasia Active Problems:   Severe cervical dysplasia   Discharged Condition: good  Hospital Course: taken to OR for LAVH/BSO. Post operatively she is ambulating well, tolerating a regular diet, has good pain control. Vital signs are stable. After discussing options she wants to go home tonight. Discussed discharge instructions.  Consults: None  Significant Diagnostic Studies: labs:  Results for orders placed or performed during the hospital encounter of 08/15/22 (from the past 24 hour(s))  Pregnancy, urine POC     Status: None   Collection Time: 08/15/22  5:33 AM  Result Value Ref Range   Preg Test, Ur NEGATIVE NEGATIVE     Treatments: IV hydration and surgery: LAVH/BSO  Discharge Exam: Blood pressure 104/63, pulse 78, temperature 97.6 F (36.4 C), temperature source Oral, resp. rate 18, height '5\' 4"'$  (1.626 m), weight 79.8 kg, last menstrual period 04/18/2022, SpO2 94 %. General appearance: alert, cooperative, and no distress GI: soft, incisions C&D  Disposition: Discharge disposition: 01-Home or Self Care        Allergies as of 08/15/2022       Reactions   Morphine    Other reaction(s): Agitation Combative   Sulfa Antibiotics    Other reaction(s): Other blisters        Medication List     STOP taking these medications    mirtazapine 7.5 MG tablet Commonly known as: REMERON       TAKE these medications    acetaminophen 500 MG tablet Commonly known as: TYLENOL Take 500 mg by mouth every 6 (six) hours as needed.   DULoxetine 60 MG capsule Commonly known as:  CYMBALTA Take 60 mg by mouth 2 (two) times daily.   gabapentin 800 MG tablet Commonly known as: NEURONTIN Take 800 mg by mouth in the morning, at noon, in the evening, and at bedtime.   ibuprofen 800 MG tablet Commonly known as: ADVIL Take 800 mg by mouth every 8 (eight) hours as needed.   methocarbamol 750 MG tablet Commonly known as: ROBAXIN Take 750 mg by mouth 4 (four) times daily.   oxyCODONE 5 MG immediate release tablet Commonly known as: Oxy IR/ROXICODONE Take 1 tablet (5 mg total) by mouth every 6 (six) hours as needed for moderate pain.         Signed: Shon Millet II 08/15/2022, 6:18 PM

## 2022-08-15 NOTE — Progress Notes (Signed)
No changes to H&P per patient history  Reviewed procedure-LAVH/BSO All questions answered She states she understands and agrees

## 2022-08-15 NOTE — Op Note (Signed)
NAME: Wendy Krause, Wendy Krause MEDICAL RECORD NO: [id number] ACCOUNT NO: [id number] DATE OF BIRTH: 12/11/1970 FACILITY: [facility] LOCATION: [facility] PHYSICIAN: Daleen Bo. Lyn Hollingshead, MD  Operative Report   DATE OF PROCEDURE: 08/15/2022  PREOPERATIVE DIAGNOSIS:  Severe cervical intraepithelial neoplasia.  POSTOPERATIVE DIAGNOSES:  Severe cervical intraepithelial neoplasia.  PROCEDURE:  Laparoscopically-assisted vaginal hysterectomy with bilateral salpingo-oophorectomy.  SURGEON:  Daleen Bo. Lyn Hollingshead, MD  ASSISTANT:  Hurshel Party, MD  ANESTHESIA:  General with endotracheal intubation.  ESTIMATED BLOOD LOSS:  50 mL.  SPECIMENS:  Uterus, bilateral fallopian tubes and ovaries to pathology.  INDICATIONS AND CONSENT:  This patient is a 51 year old G5 P4, status post tubal ligation, who has severe cervical intraepithelial neoplasia.  Details are dictated in history and physical.  Laparoscopically-assisted vaginal hysterectomy with bilateral  salpingo-oophorectomy has been discussed preoperatively.  Potential risks and complications are discussed preoperatively including but not limited to infection, organ damage, bleeding requiring transfusion of blood products with HIV and hepatitis  acquisition, DVT, PE, pneumonia, laparotomy.  The patient states she understands and agrees and consent is signed on the chart.  FINDINGS:  Upper abdomen was grossly normal.  Uterus is 6 weeks in size, smooth in contour.  Anterior and posterior cul-de-sacs were normal.  Ovaries were normal bilaterally.  DESCRIPTION OF PROCEDURE:  The patient was taken to the operating room where she was identified, placed in dorsal supine position and general anesthesia was induced via endotracheal intubation.  She was placed in the dorsal lithotomy position.  She was  prepped vaginally with Hibiclens.  Straight catheterized, prepped abdominally with ChloraPrep.  Timeout was done.  Hulka tenaculum was placed in the uterus as a manipulator and  after 3-minute drying time she was draped in a sterile fashion.  The  infraumbilical and suprapubic areas were injected in the midline with approximately 8 mL of 0.5% plain Marcaine.  A small infraumbilical incision was made.  Disposable Veress needle was placed on the first attempt without difficulty.  Good syringe and  drop test are noted.  Two liters of gas were insufflated under low pressure with good tympany in the right upper quadrant.  Veress needle was removed and a bladeless trocar sleeve was placed using direct visualization with the diagnostic scope.   After placement, the operative scope was used.  A small suprapubic incision was made in the midline and a 5 mm disposable trocar sleeve was placed under direct visualization without difficulty.  The right infundibulopelvic ligament is identified and then  taken down with the EnSeal bipolar cautery cutting instrument.  This was taken along the course of the fallopian tube to the proximal ligaments.  The round ligament was taken down, was extended down to the vesicouterine peritoneum.  Similar procedure  was carried out on the left side.  Vesicouterine peritoneum was taken down bilaterally.  Good hemostasis was noted.  Instruments were removed.  Suprapubic trocar sleeve was removed and attention was turned to the vagina.  Posterior cul-de-sac was entered  sharply and the cervix was circumscribed with unipolar cautery.  Mucosa was advanced sharply and bluntly.  Using the LigaSure bipolar cautery cutting instrument, the uterosacral ligaments were taken down followed by the bladder pillars bilaterally.   Further dissection takes the cardinal ligament and the uterine vessels bilaterally.  Anterior cul-de-sac was entered sharply.  Specimen was delivered.  All sutures will be 0 Monocryl unless otherwise designated.  Uterosacral ligaments  were plicated the vaginal cuff bilaterally and then the midline with a third suture.  Cuff  was closed with  figure-of-eights.  Foley catheter was placed and clear urine was noted.  Attention was returned to the abdomen.  Pneumoperitoneum was  reintroduced.  Careful inspection reveals minor bleeding at peritoneal edges, which was controlled with bipolar cautery and lavage.  Inspection and reduced pneumoperitoneum reveals good hemostasis.  The remaining approximately 20 mL of 0.5% plain  Marcaine is instilled into the peritoneal cavity and Arista is placed on the cuff as well.  Instruments were removed.  Pneumoperitoneum was reduced.  Umbilical incision was closed with 0 Vicryl in the subcutaneous layers of the umbilical incision and  then the skin on both incisions was closed with interrupted 4-0 Vicryl.  Dermabond was placed on both.  All counts were correct.  The patient was awakened and taken to recovery room in stable condition.     Elián.Darby D: 08/15/2022 9:13:52 am T: 08/15/2022 9:33:00 am  JOB: [id number]/ [id number]

## 2022-08-15 NOTE — Progress Notes (Signed)
08/15/2022  9:07 AM  PATIENT:  Wendy Krause  51 y.o. female  PRE-OPERATIVE DIAGNOSIS:  CERVICAL INTRAEPITHELIAL NEOPLASIA  POST-OPERATIVE DIAGNOSIS:  CERVICAL INTRAEPITHELIAL NEOPLASIA  PROCEDURE:  Procedure(s): LAPAROSCOPIC ASSISTED VAGINAL HYSTERECTOMY WITH BILATERAL SALPINGO OOPHORECTOMY (Bilateral)  SURGEON:  Surgeon(s) and Role:    * Everlene Farrier, MD - Primary    * Charlotta Newton, MD - Assisting  PHYSICIAN ASSISTANT:   ASSISTANTS: Hurshel Party MD   ANESTHESIA:   general  EBL:  50 mL   BLOOD ADMINISTERED:none  DRAINS: Urinary Catheter (Foley)   LOCAL MEDICATIONS USED:  MARCAINE    and Amount: 20 ml  SPECIMEN:  Source of Specimen:  uterus with bilateral tubes and ovaries  DISPOSITION OF SPECIMEN:  PATHOLOGY  COUNTS:  YES  TOURNIQUET:  * No tourniquets in log *  DICTATION: .Other Dictation: Dictation Number [id number]  PLAN OF CARE: Admit for overnight observation  PATIENT DISPOSITION:  PACU - hemodynamically stable.   Delay start of Pharmacological VTE agent (>24hrs) due to surgical blood loss or risk of bleeding: not applicable

## 2022-08-16 ENCOUNTER — Encounter (HOSPITAL_BASED_OUTPATIENT_CLINIC_OR_DEPARTMENT_OTHER): Payer: Self-pay | Admitting: Obstetrics and Gynecology

## 2022-08-19 LAB — SURGICAL PATHOLOGY

## 2024-06-08 ENCOUNTER — Emergency Department (HOSPITAL_COMMUNITY)

## 2024-06-08 ENCOUNTER — Emergency Department (EMERGENCY_DEPARTMENT_HOSPITAL)

## 2024-06-08 ENCOUNTER — Emergency Department (HOSPITAL_COMMUNITY): Admission: EM | Admit: 2024-06-08 | Discharge: 2024-06-08 | Disposition: A | Discharge status: Home / Self Care

## 2024-06-08 ENCOUNTER — Other Ambulatory Visit: Payer: Self-pay

## 2024-06-08 DIAGNOSIS — Z8541 Personal history of malignant neoplasm of cervix uteri: Secondary | ICD-10-CM | POA: Diagnosis not present

## 2024-06-08 DIAGNOSIS — M62830 Muscle spasm of back: Secondary | ICD-10-CM | POA: Diagnosis not present

## 2024-06-08 DIAGNOSIS — Z981 Arthrodesis status: Secondary | ICD-10-CM | POA: Diagnosis not present

## 2024-06-08 DIAGNOSIS — M5136 Other intervertebral disc degeneration, lumbar region with discogenic back pain only: Secondary | ICD-10-CM

## 2024-06-08 DIAGNOSIS — M545 Low back pain, unspecified: Secondary | ICD-10-CM | POA: Diagnosis present

## 2024-06-08 LAB — COMPREHENSIVE METABOLIC PANEL WITH GFR
ALT: 21 U/L (ref 0–44)
AST: 19 U/L (ref 15–41)
Albumin: 3.6 g/dL (ref 3.5–5.0)
Alkaline Phosphatase: 112 U/L (ref 38–126)
Anion gap: 8 (ref 5–15)
BUN: 14 mg/dL (ref 6–20)
CO2: 23 mmol/L (ref 22–32)
Calcium: 9.7 mg/dL (ref 8.9–10.3)
Chloride: 106 mmol/L (ref 98–111)
Creatinine, Ser: 0.87 mg/dL (ref 0.44–1.00)
GFR, Estimated: >60 mL/min (ref >60)
Glucose, Bld: 101 mg/dL — ABNORMAL HIGH (ref 70–99)
Potassium: 3.9 mmol/L (ref 3.5–5.1)
Sodium: 137 mmol/L (ref 135–145)
Total Bilirubin: 0.4 mg/dL (ref 0.0–1.2)
Total Protein: 7.1 g/dL (ref 6.5–8.1)

## 2024-06-08 LAB — CBC WITH DIFFERENTIAL/PLATELET
Abs Immature Granulocytes: 0.06 K/uL (ref 0.00–0.07)
Basophils Absolute: 0.1 K/uL (ref 0.0–0.1)
Basophils Relative: 1 %
Eosinophils Absolute: 0.2 K/uL (ref 0.0–0.5)
Eosinophils Relative: 2 %
HCT: 40.6 % (ref 36.0–46.0)
Hemoglobin: 13.2 g/dL (ref 12.0–15.0)
Immature Granulocytes: 1 %
Lymphocytes Relative: 27 %
Lymphs Abs: 2.4 K/uL (ref 0.7–4.0)
MCH: 30.3 pg (ref 26.0–34.0)
MCHC: 32.5 g/dL (ref 30.0–36.0)
MCV: 93.3 fL (ref 80.0–100.0)
Monocytes Absolute: 0.8 K/uL (ref 0.1–1.0)
Monocytes Relative: 9 %
Neutro Abs: 5.2 K/uL (ref 1.7–7.7)
Neutrophils Relative %: 60 %
Platelets: 223 K/uL (ref 150–400)
RBC: 4.35 MIL/uL (ref 3.87–5.11)
RDW: 12.2 % (ref 11.5–15.5)
WBC: 8.7 K/uL (ref 4.0–10.5)
nRBC: 0 % (ref 0.0–0.2)

## 2024-06-08 LAB — URINALYSIS, ROUTINE W REFLEX MICROSCOPIC
Bilirubin Urine: NEGATIVE
Glucose, UA: NEGATIVE mg/dL
Hgb urine dipstick: NEGATIVE
Ketones, ur: NEGATIVE mg/dL
Leukocytes,Ua: NEGATIVE
Nitrite: NEGATIVE
Protein, ur: NEGATIVE mg/dL
Specific Gravity, Urine: 1.032 — ABNORMAL HIGH (ref 1.005–1.030)
pH: 5 (ref 5.0–8.0)

## 2024-06-08 LAB — LIPASE, BLOOD: Lipase: 90 U/L — ABNORMAL HIGH (ref 11–51)

## 2024-06-08 MED ORDER — KETOROLAC TROMETHAMINE 15 MG/ML IJ SOLN
15.0000 mg | Freq: Once | INTRAMUSCULAR | Status: AC
  Administered 2024-06-08: 15 mg via INTRAMUSCULAR
  Filled 2024-06-08: qty 1

## 2024-06-08 MED ORDER — METHOCARBAMOL 500 MG PO TABS: 500.0000 mg | ORAL_TABLET | Freq: Two times a day (BID) | ORAL | 0 refills | Status: AC

## 2024-06-08 MED ORDER — GADOBUTROL 1 MMOL/ML IV SOLN
8.0000 mL | Freq: Once | INTRAVENOUS | Status: AC | PRN
  Administered 2024-06-08: 8 mL via INTRAVENOUS

## 2024-06-08 MED ORDER — DIAZEPAM 2 MG PO TABS
2.0000 mg | ORAL_TABLET | Freq: Once | ORAL | Status: AC
  Administered 2024-06-08: 2 mg via ORAL
  Filled 2024-06-08: qty 1

## 2024-06-08 MED ORDER — HYDROCODONE-ACETAMINOPHEN 5-325 MG PO TABS
1.0000 | ORAL_TABLET | Freq: Once | ORAL | Status: AC
  Administered 2024-06-08: 1 via ORAL
  Filled 2024-06-08: qty 1

## 2024-06-08 MED ORDER — LIDOCAINE 5 % EX PTCH
1.0000 | MEDICATED_PATCH | CUTANEOUS | Status: DC
  Administered 2024-06-08: 1 via TRANSDERMAL
  Filled 2024-06-08: qty 1

## 2024-06-08 MED ORDER — LIDOCAINE 5 % EX PTCH: 1.0000 | MEDICATED_PATCH | CUTANEOUS | 0 refills | Status: DC

## 2024-06-08 MED ORDER — FENTANYL CITRATE PF 50 MCG/ML IJ SOSY
50.0000 ug | PREFILLED_SYRINGE | Freq: Once | INTRAMUSCULAR | Status: AC
  Administered 2024-06-08: 50 ug via INTRAVENOUS
  Filled 2024-06-08: qty 1

## 2024-06-08 MED ORDER — ONDANSETRON HCL 4 MG/2ML IJ SOLN
4.0000 mg | Freq: Once | INTRAMUSCULAR | Status: AC
  Administered 2024-06-08: 4 mg via INTRAVENOUS
  Filled 2024-06-08: qty 2

## 2024-06-08 MED ORDER — ACETAMINOPHEN 500 MG PO TABS
1000.0000 mg | ORAL_TABLET | Freq: Once | ORAL | Status: AC
  Administered 2024-06-08: 1000 mg via ORAL
  Filled 2024-06-08: qty 2

## 2024-06-08 NOTE — ED Triage Notes (Addendum)
 PT BIB EMS for c/o back pain. Pt has chronic back pain which causes pain to entire back. A couple of hours ago pt began having sharp left lower back paint that shoots down left leg. Gabapentin  was not effective. Pt unable to ambulate due to pain.pain 10/10. More painful with movement. Bp- 124/86 hr 98 rr18 98%ra

## 2024-06-08 NOTE — ED Provider Triage Note (Signed)
 Emergency Medicine Provider Triage Evaluation Note  Wendy Krause , a 53 y.o. female  was evaluated in triage.  Pt complains of left lower back pain into left leg, worse with movement. Hx of prior spine surgeries that were messed up. No known trauma. No CP, SOB, abd pain, UTI sx. No saddle anesthesia, incontinence, fever, history of IVDU.  Takes Robaxin , gabapentin  for chronic back pain  Review of Systems  Positive: Back pain Negative: Fever, abd pain, CP, uti sx  Physical Exam  BP (!) 129/94   Pulse 83   Temp 98.2 F (36.8 C)   Resp 20   Ht 5' 4 (1.626 m)   Wt 79.8 kg   SpO2 100%   BMI 30.20 kg/m  Gen:   Awake, no distress   Resp:  Normal effort  MSK:   Moves extremities without difficulty. Diffuse tenderness to left lower back into left SI region. 2 + DP PT pulses Other:  Lower extremities well perfused, compartments soft  Medical Decision Making  Medically screening exam initiated at 4:05 AM.  Appropriate orders placed.  Yanelly Keeli Roberg was informed that the remainder of the evaluation will be completed by another provider, this initial triage assessment does not replace that evaluation, and the importance of remaining in the ED until their evaluation is complete.  Back pain   Fumio Vandam A, PA-C 06/08/24 [id number]

## 2024-06-08 NOTE — ED Provider Notes (Signed)
 [place] EMERGENCY DEPARTMENT AT [facility] Provider Note   CSN: [id number] Arrival date & time: 06/08/24  9653     Patient presents with: Back Pain   Wendy Krause is a 53 y.o. female.   53 year old female with past medical history significant for chronic back pain presents today for concern of severe back pain that started overnight after she had to wake up to go to the bathroom.  She states after she woke up before getting out of bed her back locked up and she was unable to move.  She laid there for an hour and so she finally called her children to help her because she did not want to wet my bed.  She has taken gabapentin  and was given hydrocodone  in the emergency department without any significant relief.  Denies history of IVDU.  Currently denies any chest pain, shortness of breath, abdominal pain, UTI symptoms.  Without incontinence, fever, or saddle anesthesia.  Denies any trauma.  She states she is very active around the house and has to look after multiple kids so she is constantly lifting and doing house chores.  The history is provided by the patient. No language interpreter was used.       Prior to Admission medications   Medication Sig Start Date End Date Taking? Authorizing Provider  acetaminophen  (TYLENOL ) 500 MG tablet Take 500 mg by mouth every 6 (six) hours as needed.    [provider]  DULoxetine (CYMBALTA) 60 MG capsule Take 60 mg by mouth 2 (two) times daily.    [provider]  gabapentin  (NEURONTIN ) 800 MG tablet Take 800 mg by mouth in the morning, at noon, in the evening, and at bedtime.    [provider]  ibuprofen (ADVIL) 800 MG tablet Take 800 mg by mouth every 8 (eight) hours as needed.    [provider]  methocarbamol  (ROBAXIN ) 750 MG tablet Take 750 mg by mouth 4 (four) times daily.    [provider]  oxyCODONE  (OXY IR/ROXICODONE ) 5 MG immediate release tablet Take 1 tablet (5 mg total) by  mouth every 6 (six) hours as needed for moderate pain. 08/15/22   Tomblin, James, MD    Allergies: Morphine and Sulfa antibiotics    Review of Systems  Constitutional:  Negative for chills and fever.  Respiratory:  Negative for shortness of breath.   Gastrointestinal:  Negative for abdominal pain.  Genitourinary:  Negative for dysuria.  Musculoskeletal:  Positive for back pain.  All other systems reviewed and are negative.   Updated Vital Signs BP (!) 129/94   Pulse 83   Temp 98.2 F (36.8 C)   Resp 20   Ht 5' 4 (1.626 m)   Wt 79.8 kg   SpO2 100%   BMI 30.20 kg/m   Physical Exam Vitals and nursing note reviewed.  Constitutional:      General: She is not in acute distress.    Appearance: Normal appearance. She is not ill-appearing.     Comments: Uncomfortable appearing  HENT:     Head: Normocephalic and atraumatic.     Nose: Nose normal.  Eyes:     Conjunctiva/sclera: Conjunctivae normal.  Cardiovascular:     Rate and Rhythm: Normal rate and regular rhythm.  Pulmonary:     Effort: Pulmonary effort is normal. No respiratory distress.  Abdominal:     General: There is no distension.     Palpations: Abdomen is soft.     Tenderness: There  is no abdominal tenderness. There is no guarding.  Musculoskeletal:        General: Tenderness present. No deformity. Normal range of motion.     Comments: Generalized lumbar tenderness to palpation including over the lumbar spine.  Lower extremities with good range of motion.  Skin:    Findings: No rash.  Neurological:     Mental Status: She is alert.     (all labs ordered are listed, but only abnormal results are displayed) Labs Reviewed - No data to display  EKG: None  Radiology: No results found.   Procedures   Medications Ordered in the ED  diazepam  (VALIUM ) tablet 2 mg (has no administration in time range)  ketorolac  (TORADOL ) 15 MG/ML injection 15 mg (has no administration in time range)  acetaminophen   (TYLENOL ) tablet 1,000 mg (has no administration in time range)  lidocaine  (LIDODERM ) 5 % 1 patch (has no administration in time range)  HYDROcodone -acetaminophen  (NORCO/VICODIN) 5-325 MG per tablet 1 tablet (1 tablet Oral Given 06/08/24 0410)                                    Medical Decision Making Amount and/or Complexity of Data Reviewed Labs: ordered. Radiology: ordered.  Risk OTC drugs. Prescription drug management.   Medical Decision Making / ED Course   This patient presents to the ED for concern of low back pain, this involves an extensive number of treatment options, and is a complaint that carries with it a high risk of complications and morbidity.  The differential diagnosis includes fracture, contusion, muscle spasm, muscle strain Low suspicion for UTI or pyelonephritis given no urinary tract infection and sudden onset  MDM: 53 year old female presents with back pain.  Appears uncomfortable.  Neurovascularly intact.  Denies any trauma.  Does have midline tenderness. Will obtain CT of the spine. Will provide multimodal pain control and reevaluate. At this time concern for muscle spasm.  CT obtained.  Shows narrowing, and disc bulging.  Recommends further evaluation with MRI with and without contrast. MRI obtained.  No acute finding.  No concern for cauda equina syndrome. Patient feels improved. Labs are reassuring.  Patient discharged in stable condition.  Prescribed muscle relaxer.  Discussed close follow-up with PCP.  Lab Tests: -I ordered, reviewed, and interpreted labs.   The pertinent results include:   Labs Reviewed - No data to display    EKG  EKG Interpretation Date/Time:    Ventricular Rate:    PR Interval:    QRS Duration:    QT Interval:    QTC Calculation:   R Axis:      Text Interpretation:           Imaging Studies ordered: I ordered imaging studies including CT L-spine, MRI lumbar spine with and without contrast I independently  visualized and interpreted imaging. I agree with the radiologist interpretation   Medicines ordered and prescription drug management: Meds ordered this encounter  Medications   HYDROcodone -acetaminophen  (NORCO/VICODIN) 5-325 MG per tablet 1 tablet    Refill:  0   diazepam  (VALIUM ) tablet 2 mg   ketorolac  (TORADOL ) 15 MG/ML injection 15 mg   acetaminophen  (TYLENOL ) tablet 1,000 mg   lidocaine  (LIDODERM ) 5 % 1 patch    -I have reviewed the patients home medicines and have made adjustments as needed   Reevaluation: After the interventions noted above, I reevaluated the patient and found that they have :improved  Co morbidities that complicate the patient evaluation  Past Medical History:  Diagnosis Date   Anemia    borderline per pt   Arthritis    knees, back, hands   Cancer (HCC) 2023   cervical   Carpal tunnel syndrome    bilateral, had bilateral carpal tunnel surgery in 2022   Depression    Follows with PCP, Dr. Jess @ [facility] in [place].   Failed back syndrome of lumbar spine    Pt follows with Christine Zdeb, NP at [facility], [facility] 12/25/21. Pt has had lumbar epidural steroid injetions, multiple weeks of physcial therapy, and been treated w/ Cymbalta, gabapentin ,and a low-dose oxycodone .   Headache    hx of migraines   Hernia of abdominal wall    Patient states that she has a couple of hernias in her abdomen, but they have never been diagnosed.   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  Return precaution discussed.  Patient voices understanding and is in agreement with plan.   Final diagnoses:  Muscle spasm of back    ED Discharge Orders          Ordered     methocarbamol  (ROBAXIN ) 500 MG tablet  2 times daily        06/08/24 1243    lidocaine  (LIDODERM ) 5 %  Every 24 hours        06/08/24 1243               Hildegard Loge, PA-C 06/08/24 1307    Ula Prentice SAUNDERS, MD 06/08/24 [phone]

## 2024-06-08 NOTE — ED Notes (Signed)
 Patient transported to MRI

## 2024-06-08 NOTE — Discharge Instructions (Signed)
 Follow-up with your care doctor.  No concerning findings on the MRI.  Symptoms improved after medicines in the emergency department.  Return for any emergent symptoms.

## 2024-10-20 ENCOUNTER — Encounter (HOSPITAL_BASED_OUTPATIENT_CLINIC_OR_DEPARTMENT_OTHER): Payer: Self-pay

## 2024-10-20 ENCOUNTER — Emergency Department (HOSPITAL_BASED_OUTPATIENT_CLINIC_OR_DEPARTMENT_OTHER)
Admission: EM | Admit: 2024-10-20 | Discharge: 2024-10-20 | Disposition: A | Discharge status: Home / Self Care | Attending: Emergency Medicine | Admitting: Emergency Medicine

## 2024-10-20 ENCOUNTER — Emergency Department (HOSPITAL_BASED_OUTPATIENT_CLINIC_OR_DEPARTMENT_OTHER)

## 2024-10-20 DIAGNOSIS — R1084 Generalized abdominal pain: Secondary | ICD-10-CM | POA: Insufficient documentation

## 2024-10-20 DIAGNOSIS — R140 Abdominal distension (gaseous): Secondary | ICD-10-CM | POA: Insufficient documentation

## 2024-10-20 DIAGNOSIS — M549 Dorsalgia, unspecified: Secondary | ICD-10-CM | POA: Insufficient documentation

## 2024-10-20 DIAGNOSIS — D72829 Elevated white blood cell count, unspecified: Secondary | ICD-10-CM | POA: Insufficient documentation

## 2024-10-20 DIAGNOSIS — Z79899 Other long term (current) drug therapy: Secondary | ICD-10-CM | POA: Insufficient documentation

## 2024-10-20 DIAGNOSIS — R10A2 Flank pain, left side: Secondary | ICD-10-CM

## 2024-10-20 LAB — COMPREHENSIVE METABOLIC PANEL WITH GFR
ALT: 15 U/L (ref 0–44)
AST: 28 U/L (ref 15–41)
Albumin: 4.3 g/dL (ref 3.5–5.0)
Alkaline Phosphatase: 127 U/L — ABNORMAL HIGH (ref 38–126)
Anion gap: 13 (ref 5–15)
BUN: 7 mg/dL (ref 6–20)
CO2: 22 mmol/L (ref 22–32)
Calcium: 10 mg/dL (ref 8.9–10.3)
Chloride: 102 mmol/L (ref 98–111)
Creatinine, Ser: 0.84 mg/dL (ref 0.44–1.00)
GFR, Estimated: >60 mL/min (ref >60)
Glucose, Bld: 96 mg/dL (ref 70–99)
Potassium: 4 mmol/L (ref 3.5–5.1)
Sodium: 137 mmol/L (ref 135–145)
Total Bilirubin: 0.3 mg/dL (ref 0.0–1.2)
Total Protein: 7.3 g/dL (ref 6.5–8.1)

## 2024-10-20 LAB — CBC WITH DIFFERENTIAL/PLATELET
Abs Immature Granulocytes: 0.05 K/uL (ref 0.00–0.07)
Basophils Absolute: 0.1 K/uL (ref 0.0–0.1)
Basophils Relative: 1 %
Eosinophils Absolute: 0.2 K/uL (ref 0.0–0.5)
Eosinophils Relative: 2 %
HCT: 39.2 % (ref 36.0–46.0)
Hemoglobin: 13.4 g/dL (ref 12.0–15.0)
Immature Granulocytes: 1 %
Lymphocytes Relative: 22 %
Lymphs Abs: 2.4 K/uL (ref 0.7–4.0)
MCH: 31.2 pg (ref 26.0–34.0)
MCHC: 34.2 g/dL (ref 30.0–36.0)
MCV: 91.2 fL (ref 80.0–100.0)
Monocytes Absolute: 0.8 K/uL (ref 0.1–1.0)
Monocytes Relative: 8 %
Neutro Abs: 7.3 K/uL (ref 1.7–7.7)
Neutrophils Relative %: 66 %
Platelets: 254 K/uL (ref 150–400)
RBC: 4.3 MIL/uL (ref 3.87–5.11)
RDW: 12.1 % (ref 11.5–15.5)
WBC: 10.8 K/uL — ABNORMAL HIGH (ref 4.0–10.5)
nRBC: 0 % (ref 0.0–0.2)

## 2024-10-20 LAB — URINALYSIS, ROUTINE W REFLEX MICROSCOPIC
Bilirubin Urine: NEGATIVE
Glucose, UA: NEGATIVE mg/dL
Hgb urine dipstick: NEGATIVE
Ketones, ur: NEGATIVE mg/dL
Leukocytes,Ua: NEGATIVE
Nitrite: NEGATIVE
Protein, ur: NEGATIVE mg/dL
Specific Gravity, Urine: <1.005 — ABNORMAL LOW (ref 1.005–1.030)
pH: 5.5 (ref 5.0–8.0)

## 2024-10-20 MED ORDER — LIDOCAINE 5 % EX PTCH
1.0000 | MEDICATED_PATCH | Freq: Once | CUTANEOUS | Status: DC
  Administered 2024-10-20: 18:00:00 1 via TRANSDERMAL
  Filled 2024-10-20: qty 1

## 2024-10-20 MED ORDER — KETOROLAC TROMETHAMINE 15 MG/ML IJ SOLN: 15.0000 mg | Freq: Once | INTRAMUSCULAR | Status: DC

## 2024-10-20 MED ORDER — KETOROLAC TROMETHAMINE 15 MG/ML IJ SOLN
15.0000 mg | Freq: Once | INTRAMUSCULAR | Status: AC
  Administered 2024-10-20: 19:00:00 15 mg via INTRAMUSCULAR
  Filled 2024-10-20: qty 1

## 2024-10-20 MED ORDER — LIDOCAINE 5 % EX PTCH: 1.0000 | MEDICATED_PATCH | CUTANEOUS | 0 refills | Status: AC

## 2024-10-20 NOTE — ED Triage Notes (Signed)
 Patient reports left sided back and flank pain. She says that she has been constipated and using multiple stool softeners. She has no history of blockage but was told her hydrocodone  can cause it so she is concerned. Denies urinary symptoms, and says no history of kidney stone in the past.

## 2024-10-20 NOTE — Discharge Instructions (Addendum)
 Please read and follow all provided instructions.  Your diagnoses today include:  1. Left flank pain     Tests performed today include: Complete blood cell count: Slightly high white blood cell count but no other issues Complete metabolic panel: No concerning findings EKG shows normal heart rhythm, no signs of heart irritation or abnormal heart rhythm CT renal protocol the abdomen without any acute findings or causes for your pain Vital signs. See below for your results today.   Medications prescribed:  Lidoderm  patch  Take any prescribed medications only as directed.  Home care instructions:  Follow any educational materials contained in this packet. Take your home medications as prescribed.  Continue a bowel regimen to prevent constipation.  Consider adding MiraLAX if needed.  Follow-up instructions: Please follow-up with your primary care provider in the next 2 days for further evaluation of your symptoms.    Return instructions:  SEEK IMMEDIATE MEDICAL ATTENTION IF: The pain does not go away or becomes severe  A temperature above 101F develops  Repeated vomiting occurs (multiple episodes)  The pain becomes localized to portions of the abdomen. The right side could possibly be appendicitis. In an adult, the left lower portion of the abdomen could be colitis or diverticulitis.  Blood is being passed in stools or vomit (bright red or black tarry stools)  You develop chest pain, difficulty breathing, dizziness or fainting, or become confused, poorly responsive, or inconsolable (young children) If you have any other emergent concerns regarding your health  Additional Information: Abdominal (belly) pain can be caused by many things. Your caregiver performed an examination and possibly ordered blood/urine tests and imaging (CT scan, x-rays, ultrasound). Many cases can be observed and treated at home after initial evaluation in the emergency department. Even though you are being  discharged home, abdominal pain can be unpredictable. Therefore, you need a repeated exam if your pain does not resolve, returns, or worsens. Most patients with abdominal pain don't have to be admitted to the hospital or have surgery, but serious problems like appendicitis and gallbladder attacks can start out as nonspecific pain. Many abdominal conditions cannot be diagnosed in one visit, so follow-up evaluations are very important.  Your vital signs today were: BP (!) 132/94   Pulse 86   Temp 98.7 F (37.1 C) (Oral)   Resp (!) 21   SpO2 98%  If your blood pressure (bp) was elevated above 135/85 this visit, please have this repeated by your doctor within one month. --------------

## 2024-10-20 NOTE — ED Provider Notes (Signed)
 [place] EMERGENCY DEPARTMENT AT [facility] Provider Note   CSN: [id number] Arrival date & time: 10/20/24  1327     Patient presents with: Back Pain and Flank Pain   Wendy Krause is a 53 y.o. female.   Patient with history of chronic lower back pain, currently on hydrocodone  daily -- presents to the emergency department for evaluation of left-sided flank pain.  Patient reports that around Thanksgiving she had a bout of constipation with generalized abdominal pain and bloating.  This was treated with several days of Dulcolax.  This led to her having large bowel movement and improvement in symptoms.  Since that time she has had decrease in stooling, small hard stools, despite taking stool softener at home.  She started feeling worse and again took Dulcolax.  She had a good bowel movement yesterday however the left sided flank pain seem to become worse.  This is in a very focal area in the left flank and lower rib area.  No associated vomiting.  No blood in the stool.  No urinary symptoms.  No chest pain or shortness of breath.  Pain is worse with movement, lying on that side, palpation.  No skin rashes. Patient denies risk factors for pulmonary embolism including: unilateral leg swelling, history of DVT/PE/other blood clots, use of exogenous hormones, recent immobilizations, recent surgery, recent travel (>4hr segment), malignancy, hemoptysis.  Patient reports not taking her home medications today including hydrocodone  and gabapentin , muscle relaxer due to not wanting to mask pain.         Prior to Admission medications  Medication Sig Start Date End Date Taking? Authorizing Provider  acetaminophen  (TYLENOL ) 500 MG tablet Take 500 mg by mouth every 6 (six) hours as needed.    [provider]  DULoxetine (CYMBALTA) 60 MG capsule Take 60 mg by mouth 2 (two) times daily.    [provider]  gabapentin  (NEURONTIN ) 800 MG tablet Take 800 mg by mouth in the  morning, at noon, in the evening, and at bedtime.    [provider]  ibuprofen (ADVIL) 800 MG tablet Take 800 mg by mouth every 8 (eight) hours as needed.    [provider]  lidocaine  (LIDODERM ) 5 % Place 1 patch onto the skin daily. Remove & Discard patch within 12 hours or as directed by MD 06/08/24   Hildegard Loge, PA-C  methocarbamol  (ROBAXIN ) 500 MG tablet Take 1 tablet (500 mg total) by mouth 2 (two) times daily. 06/08/24   Hildegard Loge, PA-C  oxyCODONE  (OXY IR/ROXICODONE ) 5 MG immediate release tablet Take 1 tablet (5 mg total) by mouth every 6 (six) hours as needed for moderate pain. 08/15/22   Tomblin, James, MD    Allergies: Morphine and Sulfa antibiotics    Review of Systems  Updated Vital Signs BP (!) 133/104 (BP Location: Right Arm)   Pulse (!) 108   Temp 97.9 F (36.6 C)   Resp 18   SpO2 99%   Physical Exam Vitals and nursing note reviewed.  Constitutional:      General: She is not in acute distress.    Appearance: She is well-developed.  HENT:     Head: Normocephalic and atraumatic.     Right Ear: External ear normal.     Left Ear: External ear normal.     Nose: Nose normal.  Eyes:     Conjunctiva/sclera: Conjunctivae normal.  Cardiovascular:     Rate and Rhythm: Normal rate and regular rhythm.  Heart sounds: No murmur heard. Pulmonary:     Effort: No respiratory distress.     Breath sounds: No wheezing, rhonchi or rales.  Abdominal:     Palpations: Abdomen is soft.     Tenderness: There is abdominal tenderness (Left lateral flank). There is no guarding or rebound.     Comments: Patient winces when I press over a small area in the left lateral flank.  No swelling or deformities palpated.  Musculoskeletal:     Cervical back: Normal range of motion and neck supple.     Right lower leg: No edema.     Left lower leg: No edema.  Skin:    General: Skin is warm and dry.     Findings: No rash.     Comments: No rash or signs of shingles in the area  of tenderness.  Neurological:     General: No focal deficit present.     Mental Status: She is alert. Mental status is at baseline.     Motor: No weakness.  Psychiatric:        Mood and Affect: Mood normal.     (all labs ordered are listed, but only abnormal results are displayed) Labs Reviewed  URINALYSIS, ROUTINE W REFLEX MICROSCOPIC - Abnormal; Notable for the following components:      Result Value   Color, Urine COLORLESS (*)    Specific Gravity, Urine <1.005 (*)    All other components within normal limits  CBC WITH DIFFERENTIAL/PLATELET - Abnormal; Notable for the following components:   WBC 10.8 (*)    All other components within normal limits  COMPREHENSIVE METABOLIC PANEL WITH GFR - Abnormal; Notable for the following components:   Alkaline Phosphatase 127 (*)    All other components within normal limits    EKG: None  Radiology: CT Renal Stone Study Result Date: 10/20/2024 CLINICAL DATA:  Abdominal/flank pain. EXAM: CT ABDOMEN AND PELVIS WITHOUT CONTRAST TECHNIQUE: Multidetector CT imaging of the abdomen and pelvis was performed following the standard protocol without IV contrast. RADIATION DOSE REDUCTION: This exam was performed according to the departmental dose-optimization program which includes automated exposure control, adjustment of the mA and/or kV according to patient size and/or use of iterative reconstruction technique. COMPARISON:  None Available. FINDINGS: Evaluation of this exam is limited in the absence of intravenous contrast. Lower chest: The visualized lung bases are clear. No intra-abdominal free air or free fluid. Hepatobiliary: No focal liver abnormality is seen. No gallstones, gallbladder wall thickening, or biliary dilatation. Pancreas: Unremarkable. No pancreatic ductal dilatation or surrounding inflammatory changes. Spleen: Normal in size without focal abnormality. Adrenals/Urinary Tract: The right adrenal glands unremarkable. There is a 15 mm low  attenuating lesion anterior to the left adrenal gland, likely an adenoma. The kidneys, visualized ureters, and urinary bladder appear unremarkable. Stomach/Bowel: There is no bowel obstruction or active inflammation. The appendix is normal. Vascular/Lymphatic: The abdominal aorta and IVC unremarkable. No portal venous gas. There is no adenopathy. Reproductive: Hysterectomy.  No suspicious adnexal masses. Other: None Musculoskeletal: L5-S1 fusion.  No acute osseous pathology. IMPRESSION: 1. No acute intra-abdominal or pelvic pathology. No hydronephrosis or nephrolithiasis. 2. No bowel obstruction. Normal appendix. Electronically Signed   By: Vanetta Chou M.D.   On: 10/20/2024 17:13     Procedures   Medications Ordered in the ED  lidocaine  (LIDODERM ) 5 % 1 patch (1 patch Transdermal Patch Applied 10/20/24 1803)  ketorolac  (TORADOL ) 15 MG/ML injection 15 mg (has no administration in time range)  ED Course  Patient seen and examined. History obtained directly from patient. Work-up including labs, imaging, EKG ordered in triage, if performed, were reviewed.    Labs/EKG: Independently reviewed and interpreted.  This included: CBC with elevated white blood cell count at 10.8, normal hemoglobin; CMP alkaline phosphatase 127 otherwise unremarkable; UA unremarkable.  Imaging: I have ordered CT renal protocol  Medications/Fluids: Ordered: Lidoderm  patch  Most recent vital signs reviewed and are as follows: BP (!) 133/104 (BP Location: Right Arm)   Pulse (!) 108   Temp 97.9 F (36.6 C)   Resp 18   SpO2 99%   Initial impression: Flank pain, not improved with stool softener.  6:45 PM Reassessment performed. Patient appears stable.  She reports ongoing left-sided flank pain.  Imaging personally visualized and interpreted including: CT renal protocol, agree no acute findings to explain the patient's symptoms.  No renal stones.  No signs of perforation.  Base of lungs appear clear.  Reviewed  pertinent lab work and imaging with patient at bedside. Questions answered.   Most current vital signs reviewed and are as follows: BP (!) 132/94   Pulse 86   Temp 98.7 F (37.1 C) (Oral)   Resp (!) 21   SpO2 98%   Plan: Discharge to home.  Offered additional medication for pain, patient declines.  She does accept IM Toradol  after discussion.  Prescriptions written for: Lidoderm  patch  Other home care instructions discussed: Topical medications, ice/heat, continued home medications  ED return instructions discussed: We discussed that abdominal/flank pain can be difficult and that symptoms can progress and change over the period of time.  Initial workups could potentially be unrevealing.  The patient was urged to return to the Emergency Department immediately with worsening of current symptoms, worsening abdominal pain, persistent vomiting, blood noted in stools, fever, or any other concerns. The patient verbalized understanding.   Follow-up instructions discussed: Patient encouraged to follow-up with their PCP in 2 days.                                   Medical Decision Making Amount and/or Complexity of Data Reviewed Labs: ordered. Radiology: ordered.   For this patient's complaint of abdominal pain, the following conditions were considered on the differential diagnosis: gastritis/PUD, enteritis/duodenitis, appendicitis, cholelithiasis/cholecystitis, cholangitis, pancreatitis, ruptured viscus, colitis, diverticulitis, small/large bowel obstruction, proctitis, cystitis, pyelonephritis, ureteral colic, aortic dissection, aortic aneurysm. In women, ectopic pregnancy, pelvic inflammatory disease, ovarian cysts, and tubo-ovarian abscess were also considered. Atypical chest etiologies were also considered including ACS, PE, and pneumonia.  Lab workup is overall reassuring with only minimally elevated white blood cell count and alkaline phosphatase.  CT renal protocol was unrevealing  without signs of kidney stone, perforation, or other inflammatory findings.  No evidence of bowel obstruction.  EKG was normal.  Low concern for PE given lack of chest pain or shortness of breath.  No hypoxia or persistent tachycardia.  At this time we will focus on symptom control and close monitoring of symptoms.  The patient's vital signs, pertinent lab work and imaging were reviewed and interpreted as discussed in the ED course. Hospitalization was considered for further testing, treatments, or serial exams/observation. However as patient is well-appearing, has a stable exam, and reassuring studies today, I do not feel that they warrant admission at this time. This plan was discussed with the patient who verbalizes agreement and comfort with this plan and seems reliable and  able to return to the Emergency Department with worsening or changing symptoms.       Final diagnoses:  Left flank pain    ED Discharge Orders          Ordered    lidocaine  (LIDODERM ) 5 %  Every 24 hours        10/20/24 1833               Desiderio Chew, PA-C 10/20/24 AMOS Charlyn Sora, MD 10/20/24 [phone]
